# Patient Record
Sex: Female | Born: 1950 | Race: White | Hispanic: No | Smoking: Never smoker
Health system: Southern US, Academic
[De-identification: ages and names within clinical notes are randomized; demographics above are authoritative.]

## PROBLEM LIST (undated history)

## (undated) DIAGNOSIS — R002 Palpitations: Secondary | ICD-10-CM

## (undated) DIAGNOSIS — E785 Hyperlipidemia, unspecified: Secondary | ICD-10-CM

## (undated) DIAGNOSIS — I10 Essential (primary) hypertension: Secondary | ICD-10-CM

## (undated) HISTORY — DX: Palpitations: R00.2

## (undated) HISTORY — DX: Essential (primary) hypertension: I10

## (undated) HISTORY — PX: HX APPENDECTOMY: SHX54

## (undated) HISTORY — DX: Hyperlipidemia, unspecified: E78.5

## (undated) HISTORY — PX: HX GALL BLADDER SURGERY/CHOLE: SHX55

## (undated) HISTORY — PX: HX TUBAL LIGATION: SHX77

---

## 2001-12-21 ENCOUNTER — Ambulatory Visit (HOSPITAL_COMMUNITY): Payer: Self-pay

## 2007-06-22 IMAGING — MG MAMMO SCREEN W CAD
1 series · 4 of 4 positions shown · non-contrast
Comparison: 06/16/06 and 03/19/04.

Olivia, Cheo
HISTORY: Screening.

[Series 2: R CC · right · 4 of 4 slices shown]
[im 1/4]
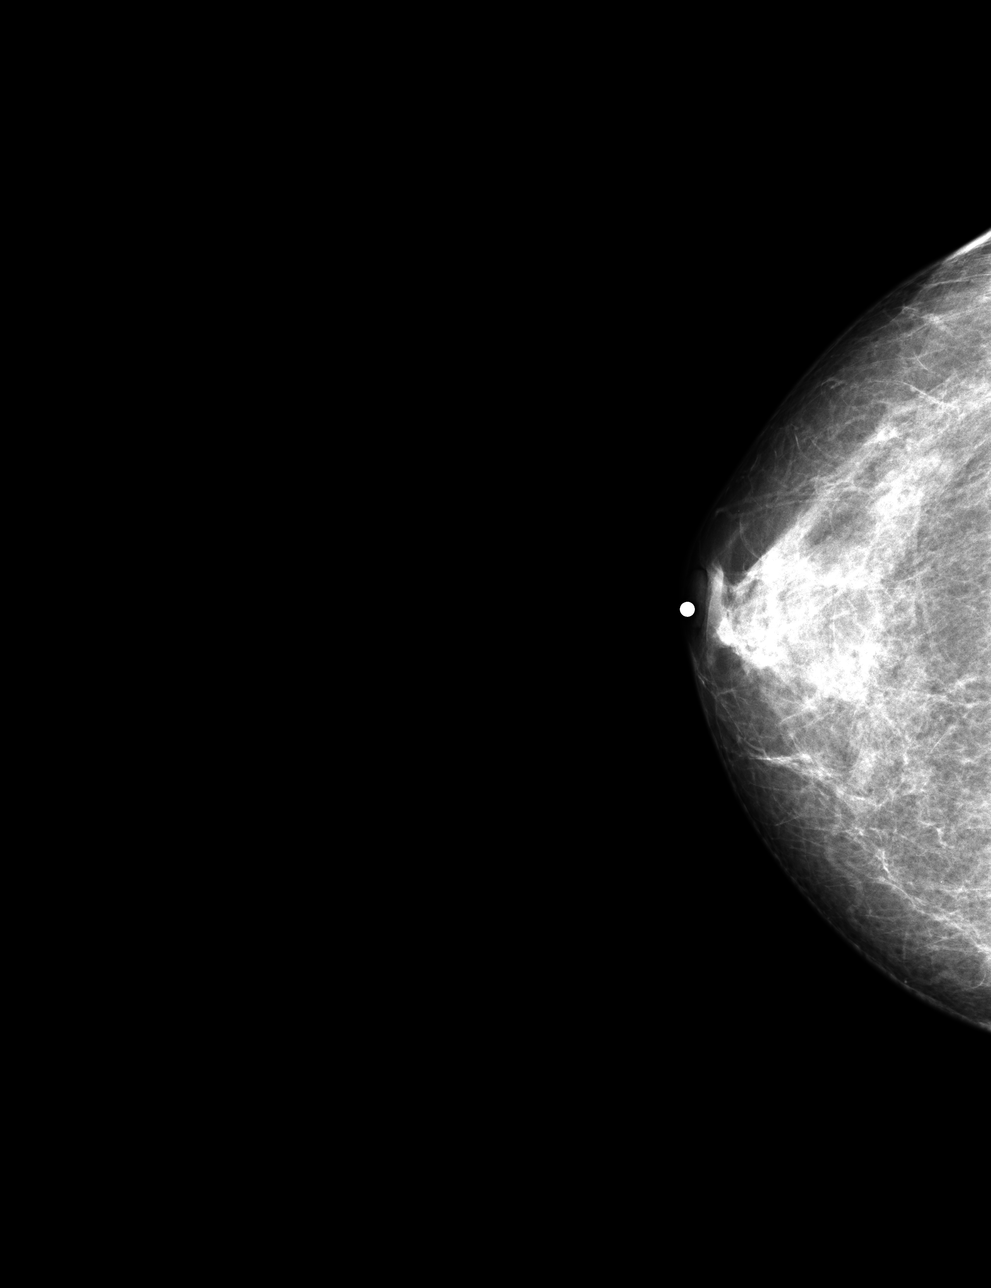
[im 2/4]
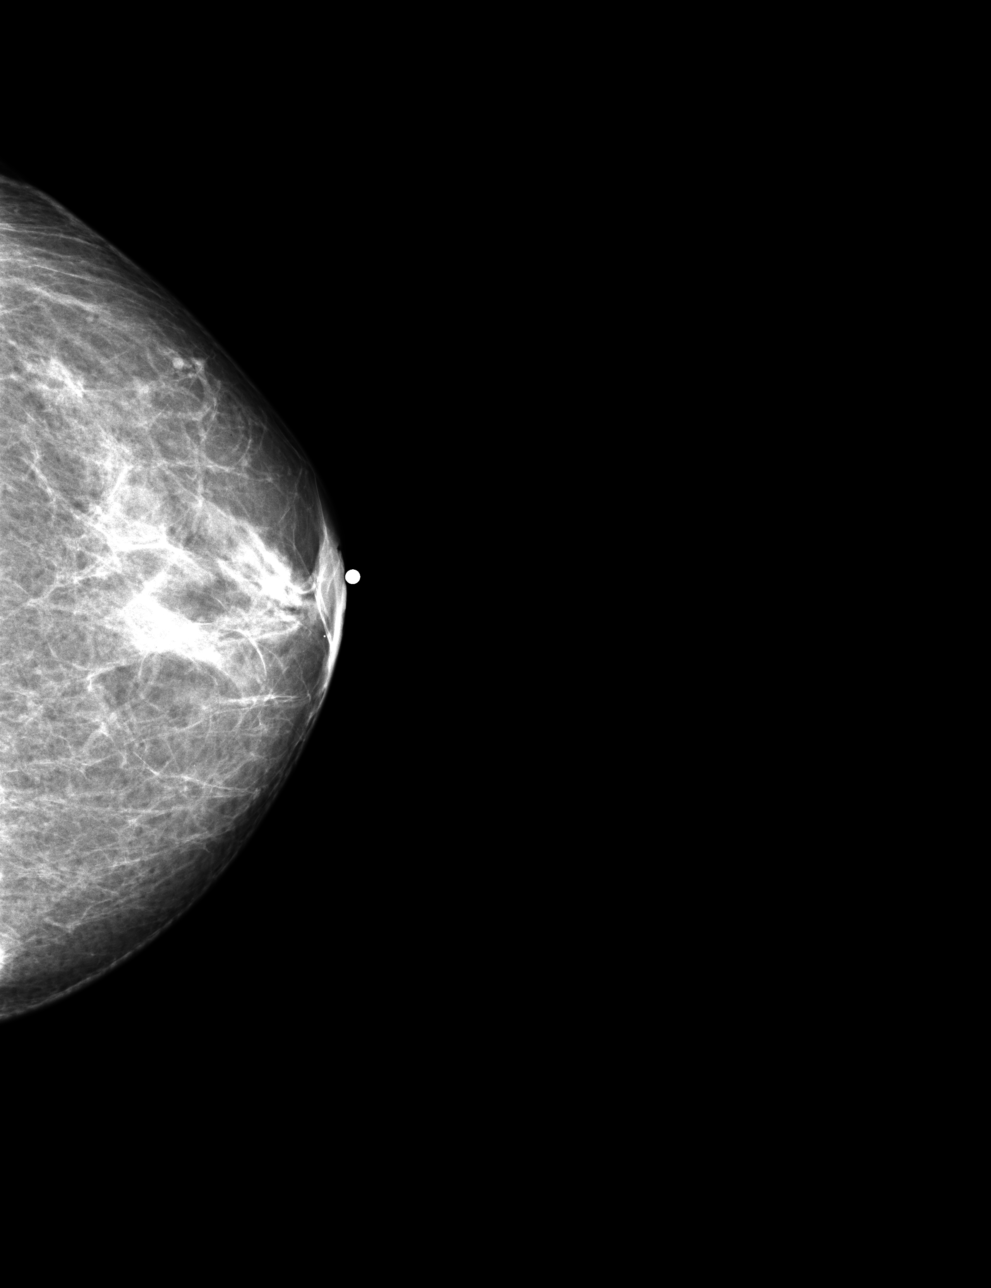
[im 3/4]
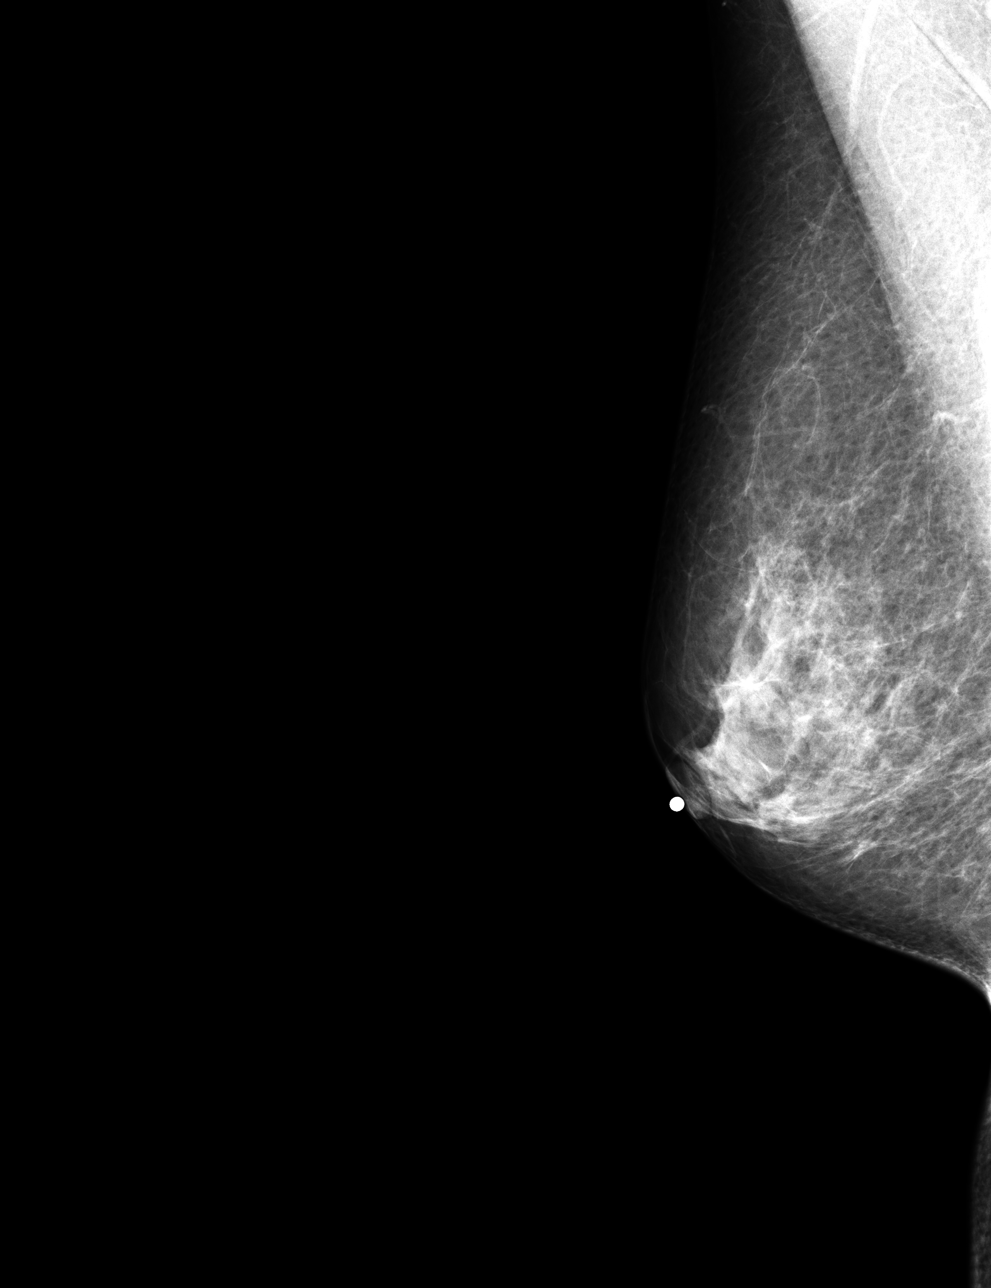
[im 4/4]
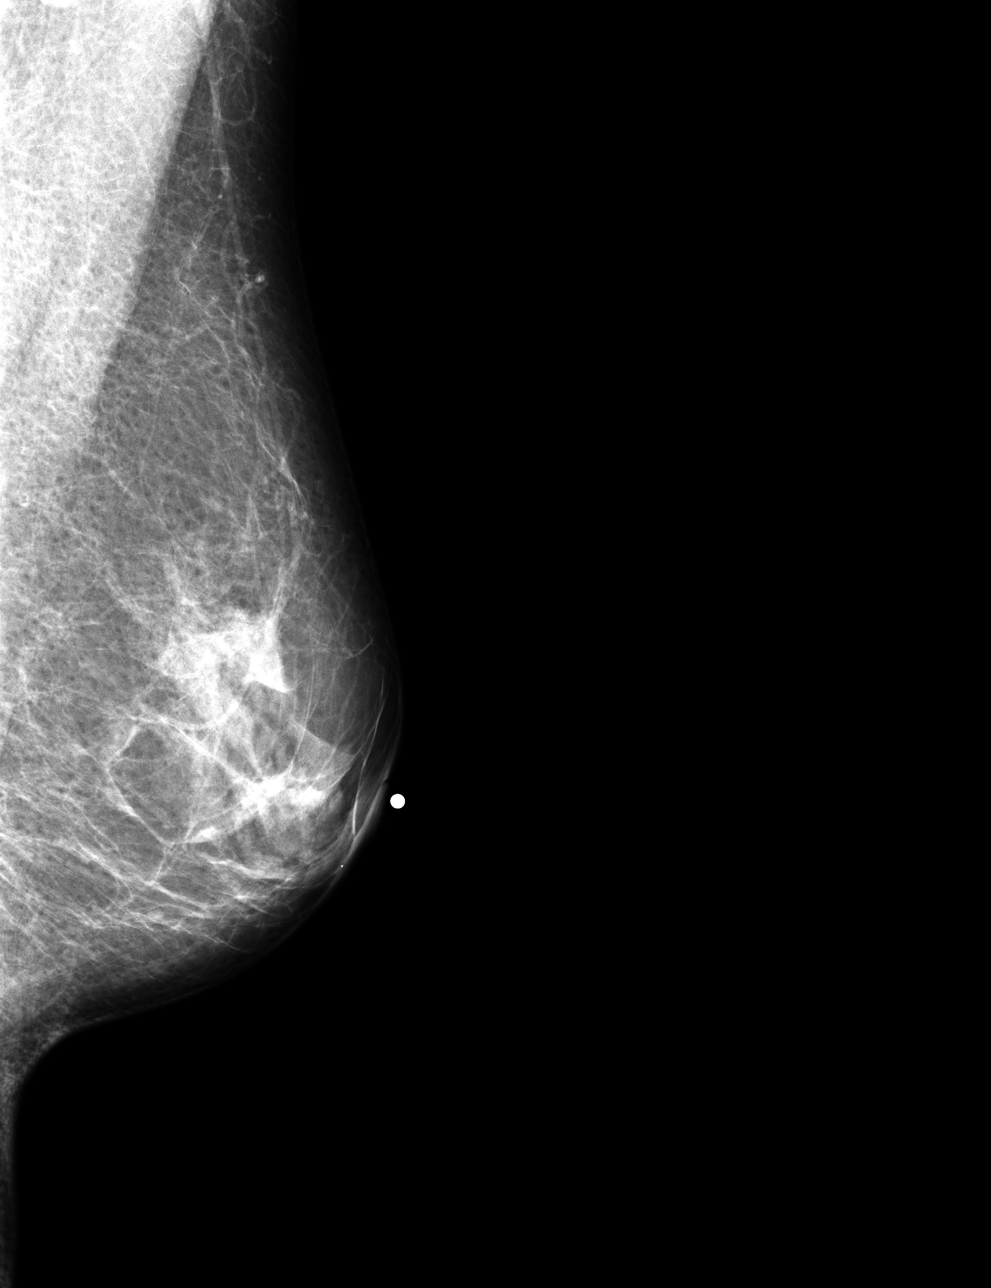

[4 of 4 positions shown; findings below may reference images not displayed]

FINDINGS: Digital mammography was performed with CAD. 

There is a moderate amount of parenchyma with a benign appearance in a symmetrical distribution. No malignant appearing masses, spiculated lesions, or suspicious microcalcifications are seen. No significant change is evident compared to prior studies dated 06/16/06 and 03/19/04.
NOTE:
In compliance with Federal regulations, the results of this mammogram are being sent to the patient.
IMPRESSION: No radiographic evidence of malignancy. 

Final Assessment Code:
Bi-Rads 1-negative

BI-RADS 0
Need additional imaging evaluation
BI-RADS 1
Negative mammogram
BI-RADS 2
Benign finding
BI-RADS 3
Probably benign finding: short-interval follow-up suggested
BI-RADS 4
Suspicious abnormality:  biopsy should be considered
BI-RADS 5
Highly suggestive of malignancy; appropriate action should be taken

________________________________

## 2008-08-05 IMAGING — MG MAMMO SCREEN W CAD
1 series · 4 of 4 positions shown · non-contrast
Comparison: none

------------- REPORT [id number] -------------
Ouna, Takaitei
HISTORY: Screening.
TECHNIQUE: Digital screening mammography was performed with CAD.

[Series 5: R CC · right · 4 of 4 slices shown]
[im 1/4]
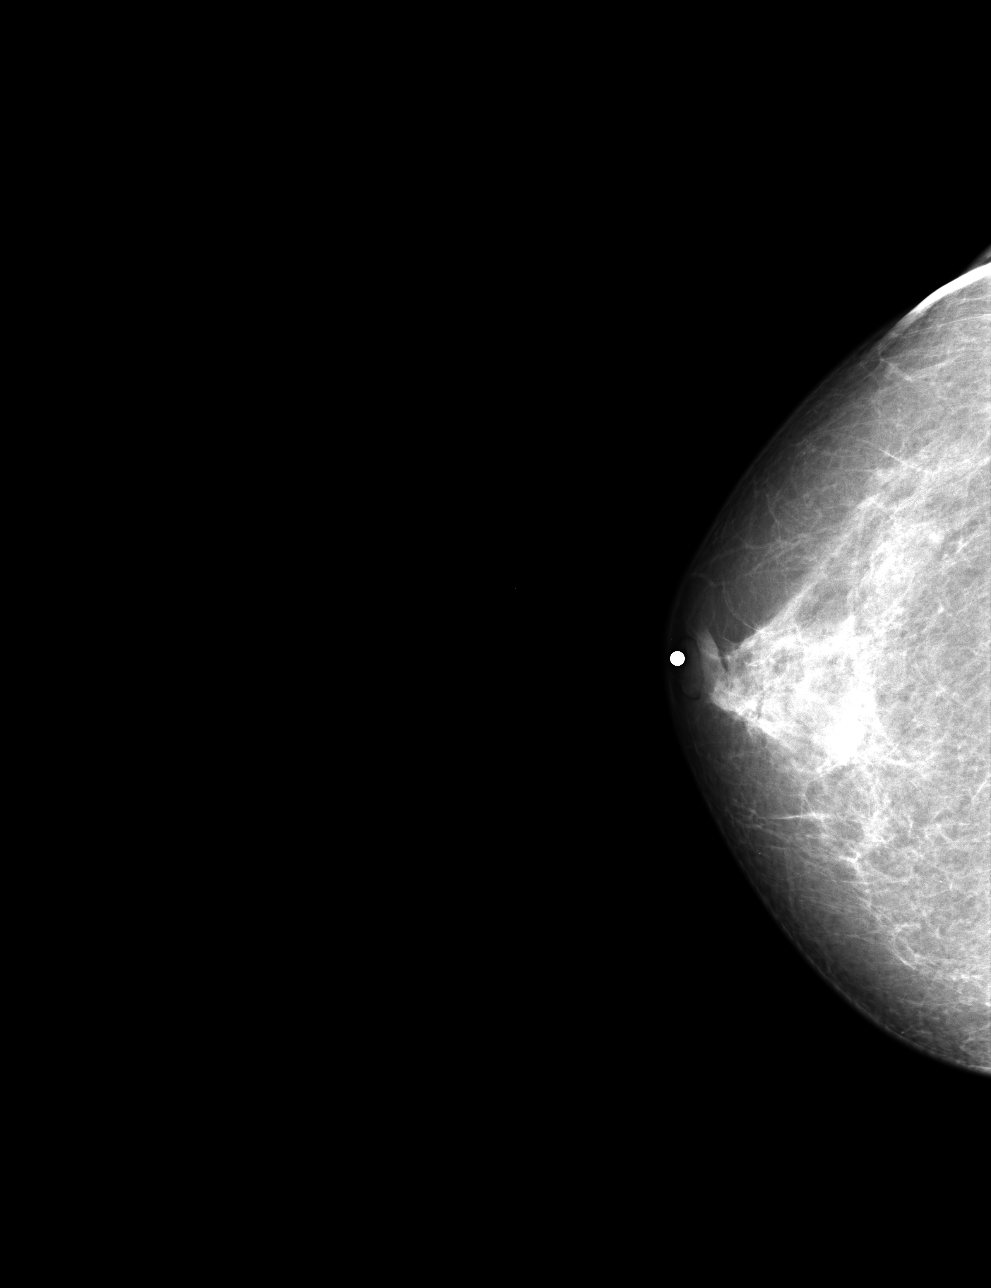
[im 2/4]
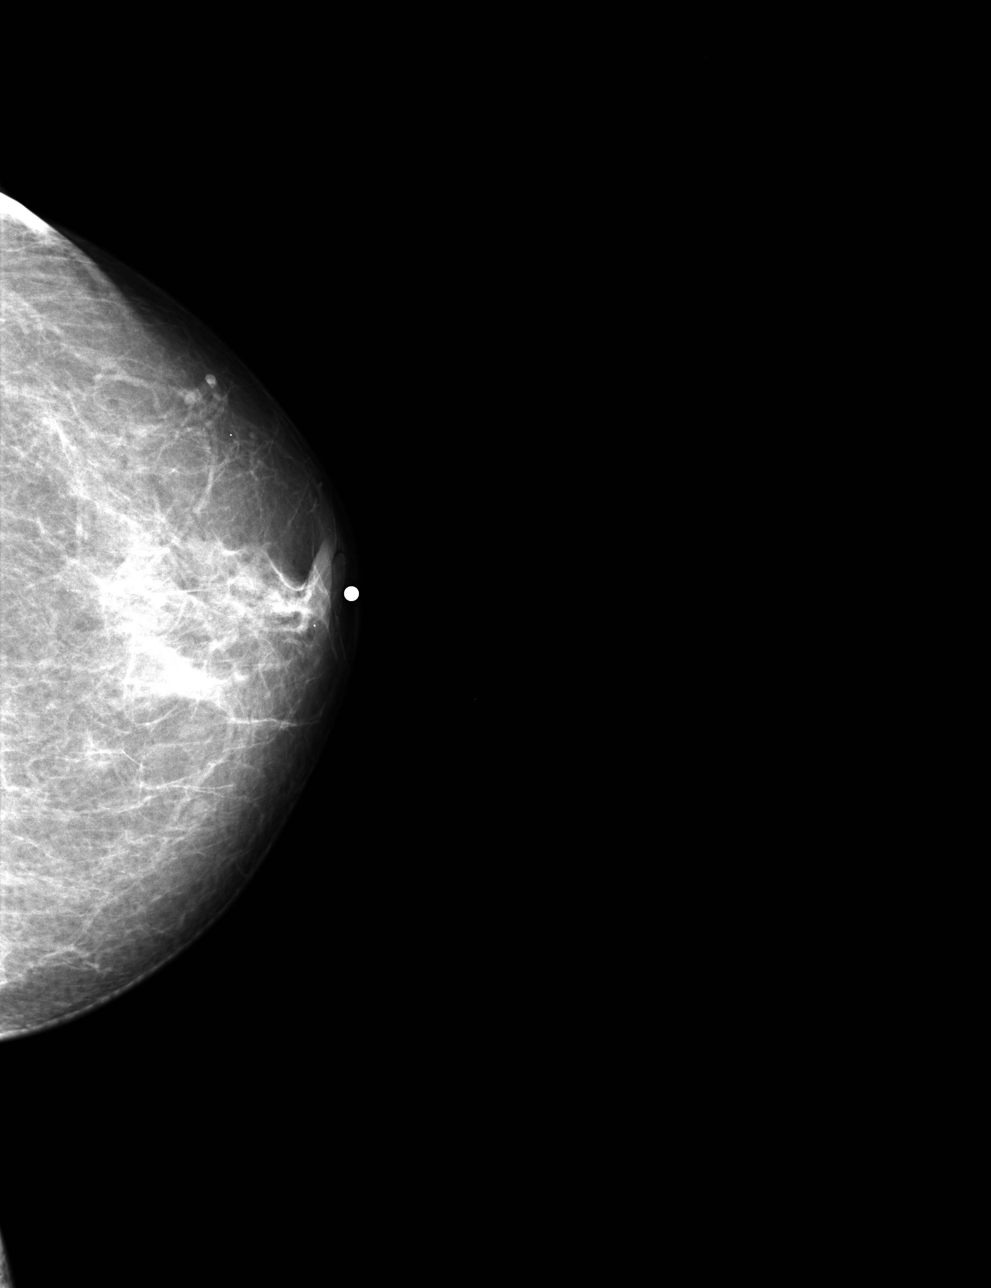
[im 3/4]
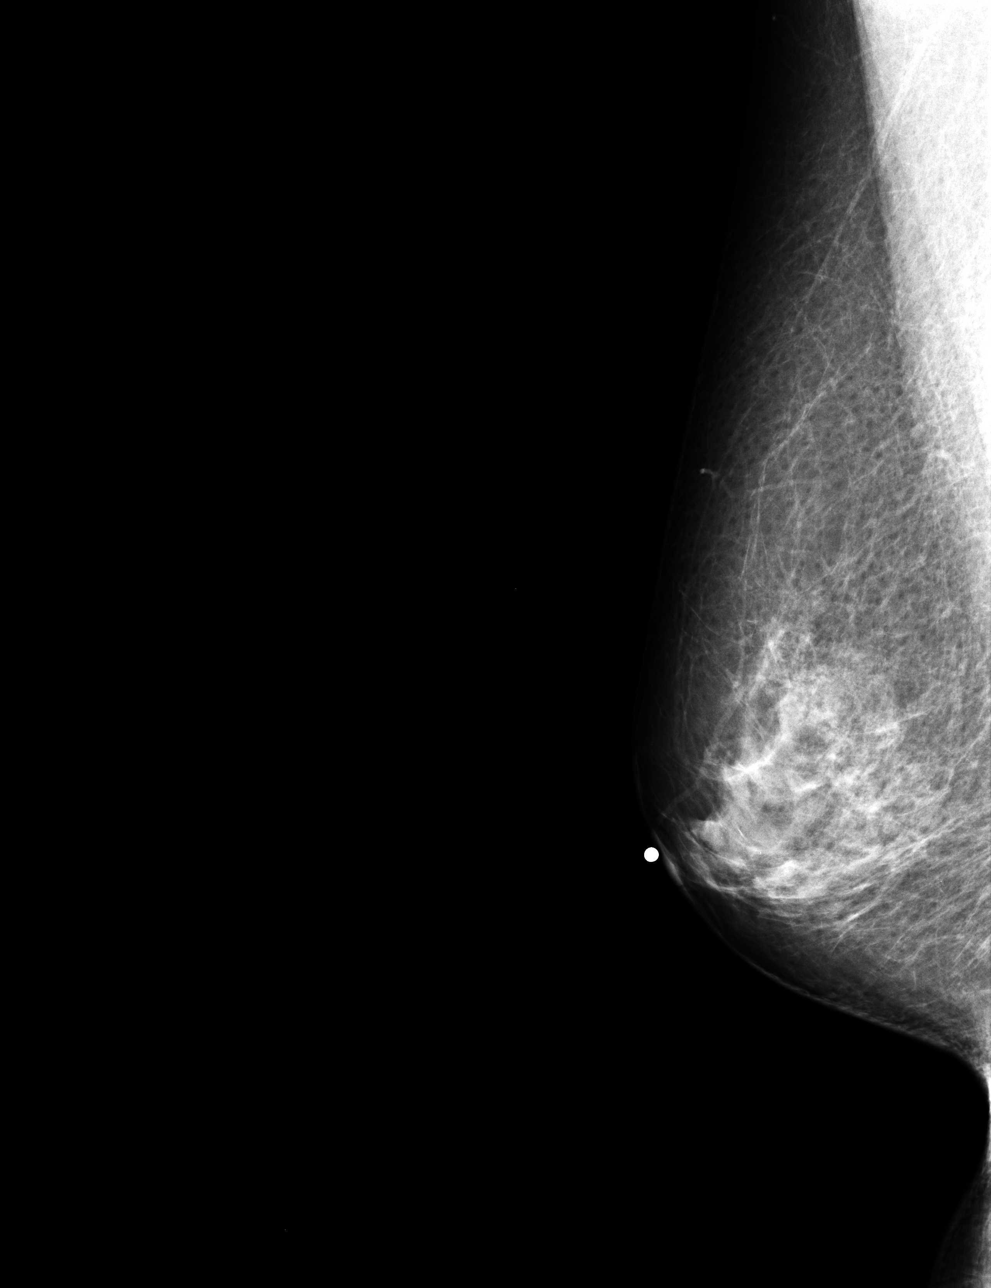
[im 4/4]
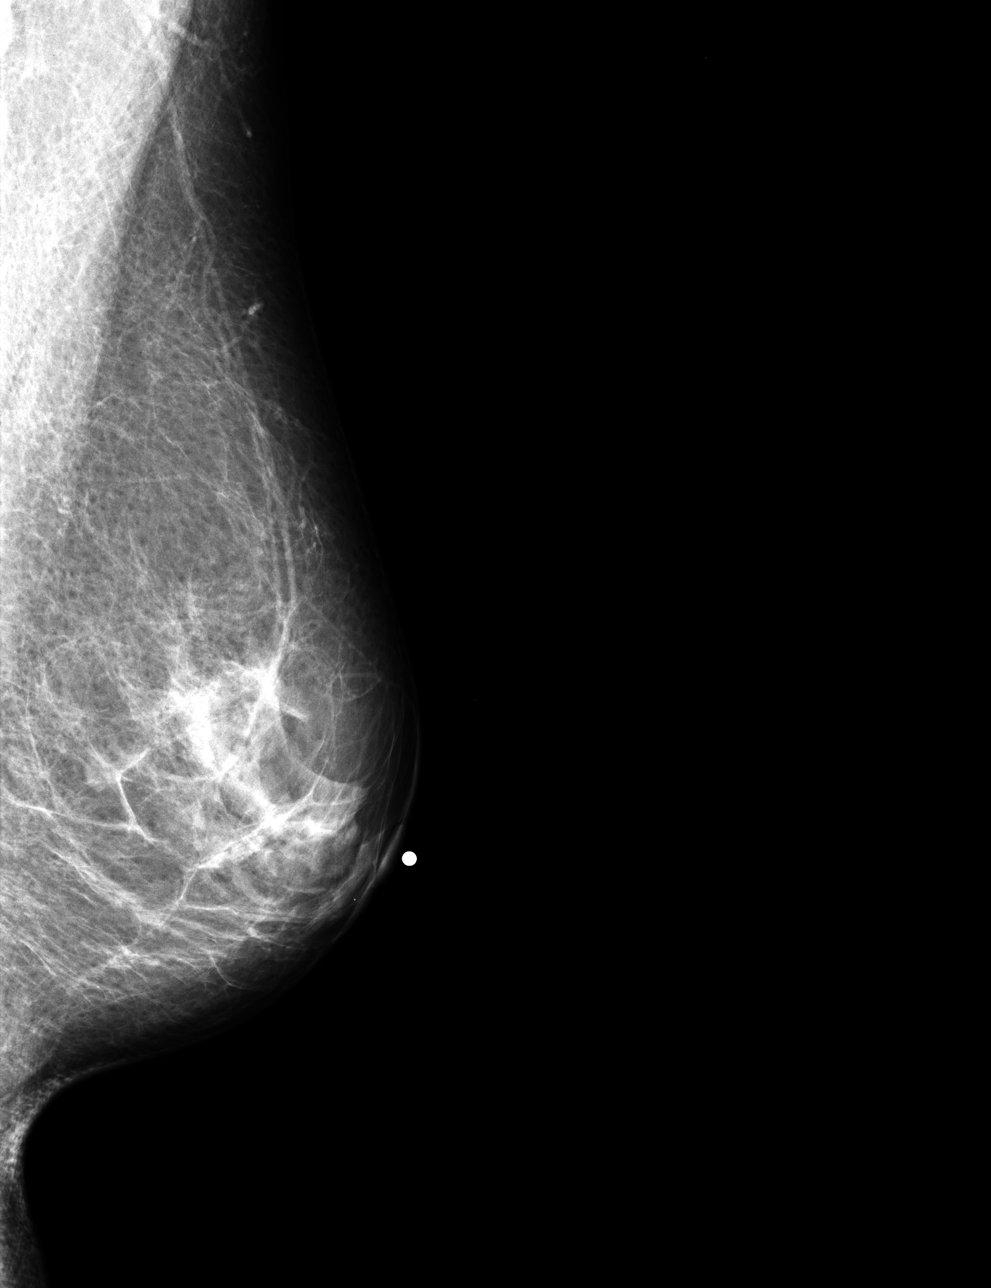

[4 of 4 positions shown; findings below may reference images not displayed]

FINDINGS: There is a moderate amount of parenchyma with a benign appearance in a symmetrical distribution.  No malignant-appearing masses, spiculated lesions, or suspicious microcalcifications are seen.  No significant change is evident compared to prior films dated July 24, 2009.

NOTE:

In compliance with Federal regulations, the results of this mammogram are being sent to the patient.
IMPRESSION: No radiographic evidence of malignancy

Final Assessment Code:  BI-RADS 1-Negative

BI-RADS 0
Need additional imaging evaluation
BI-RADS 1
Negative mammogram
BI-RADS 2
Benign finding
BI-RADS 3
Probably benign finding - short interval follow-up suggested
BI-RADS 4
Suspicious abnormality: biopsy should be considered
BI-RADS 5
Highly suggestive of malignancy; appropriate action should be taken

________________________________

------------- REPORT [id number] -------------
Espina, Jehrome
[address]

## 2009-08-09 IMAGING — MG MAMMO SCREEN W CAD
1 series · 4 of 4 positions shown · non-contrast
Comparison: 06/22/2007 and 08/05/2008.

Delvalle, Geary

EXAM:
BILATERAL DIGITAL SCREENING MAMMOGRAM WITH COMPUTER ASSISTED DIAGNOSIS:
HISTORY: Asymptomatic 58-year old with no family history of breast cancer in first degree relatives.

[Series 2: R CC · right · 4 of 4 slices shown]
[im 1/4]
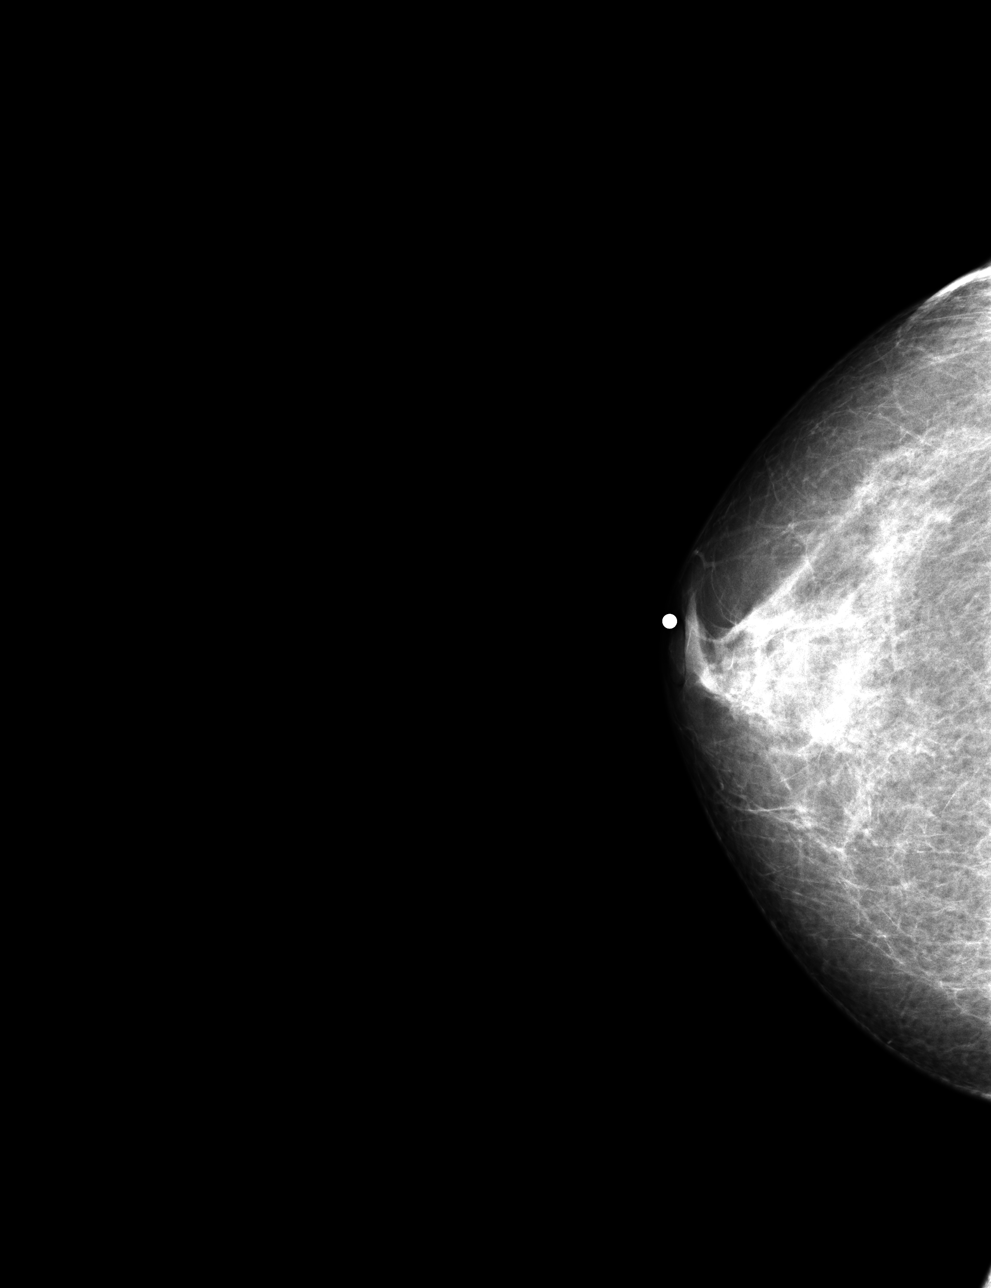
[im 2/4]
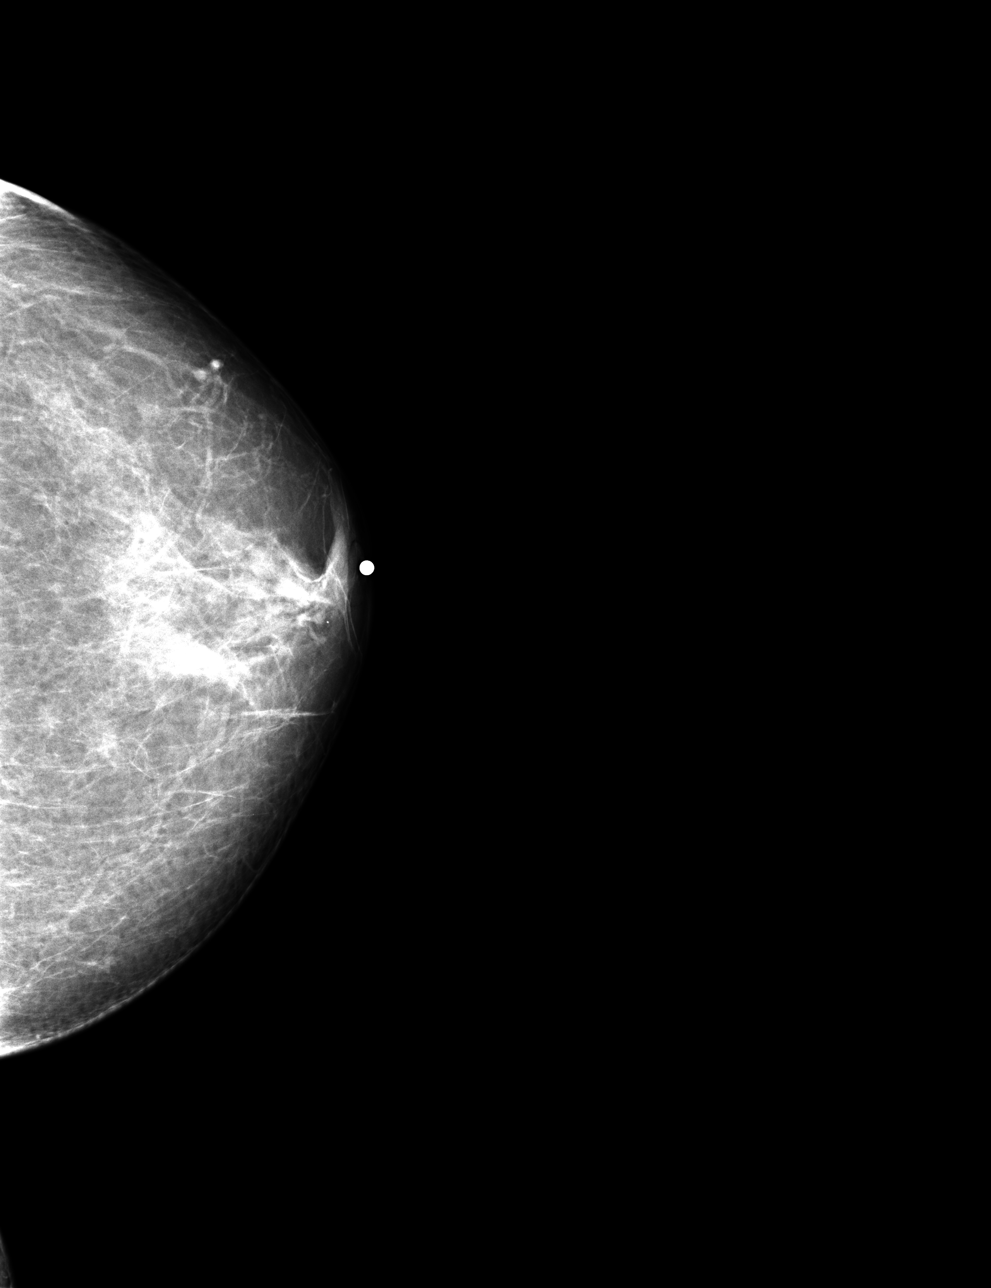
[im 3/4]
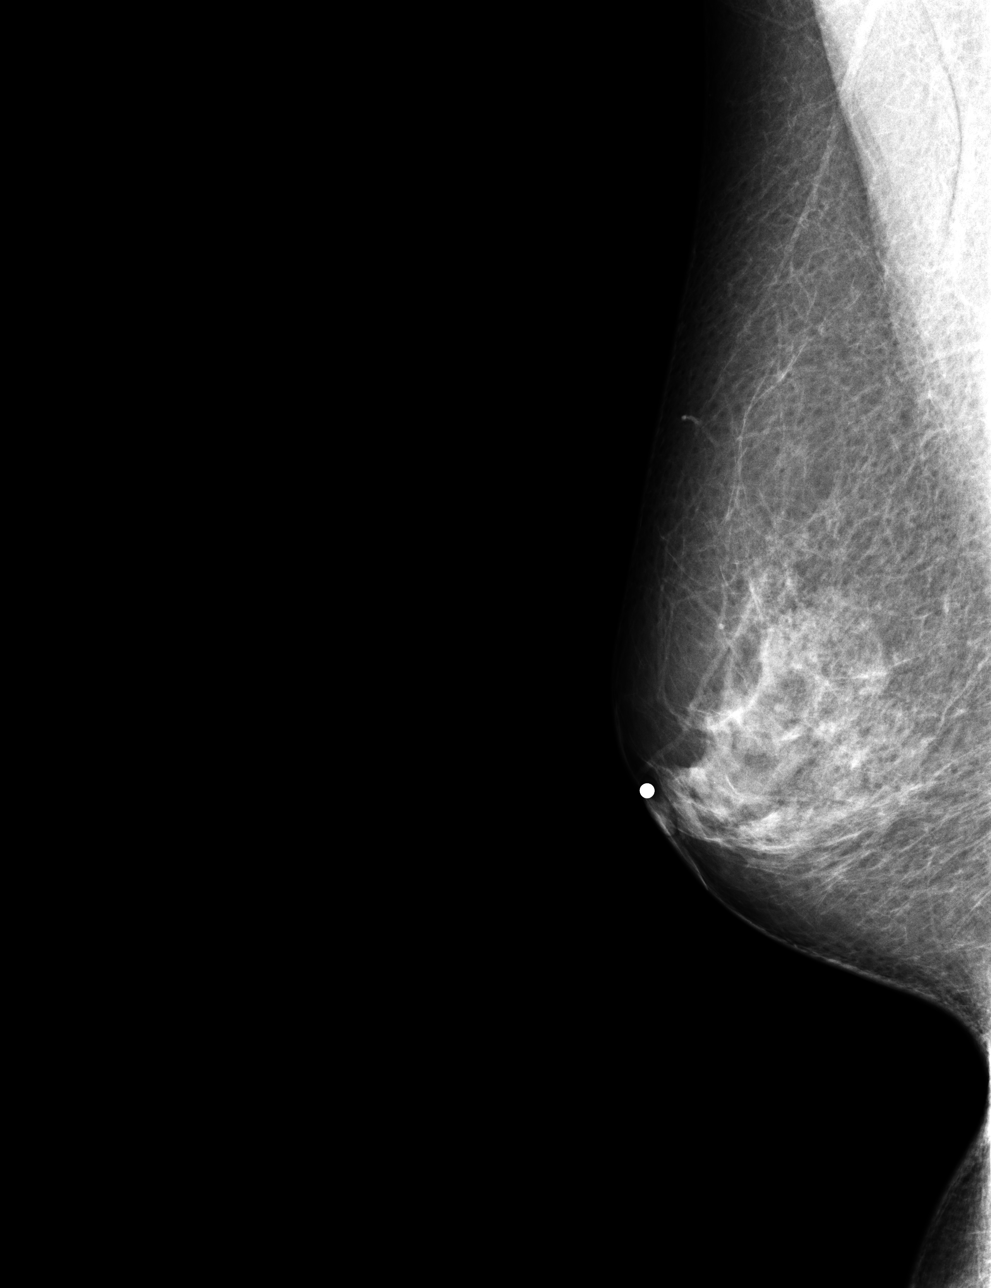
[im 4/4]
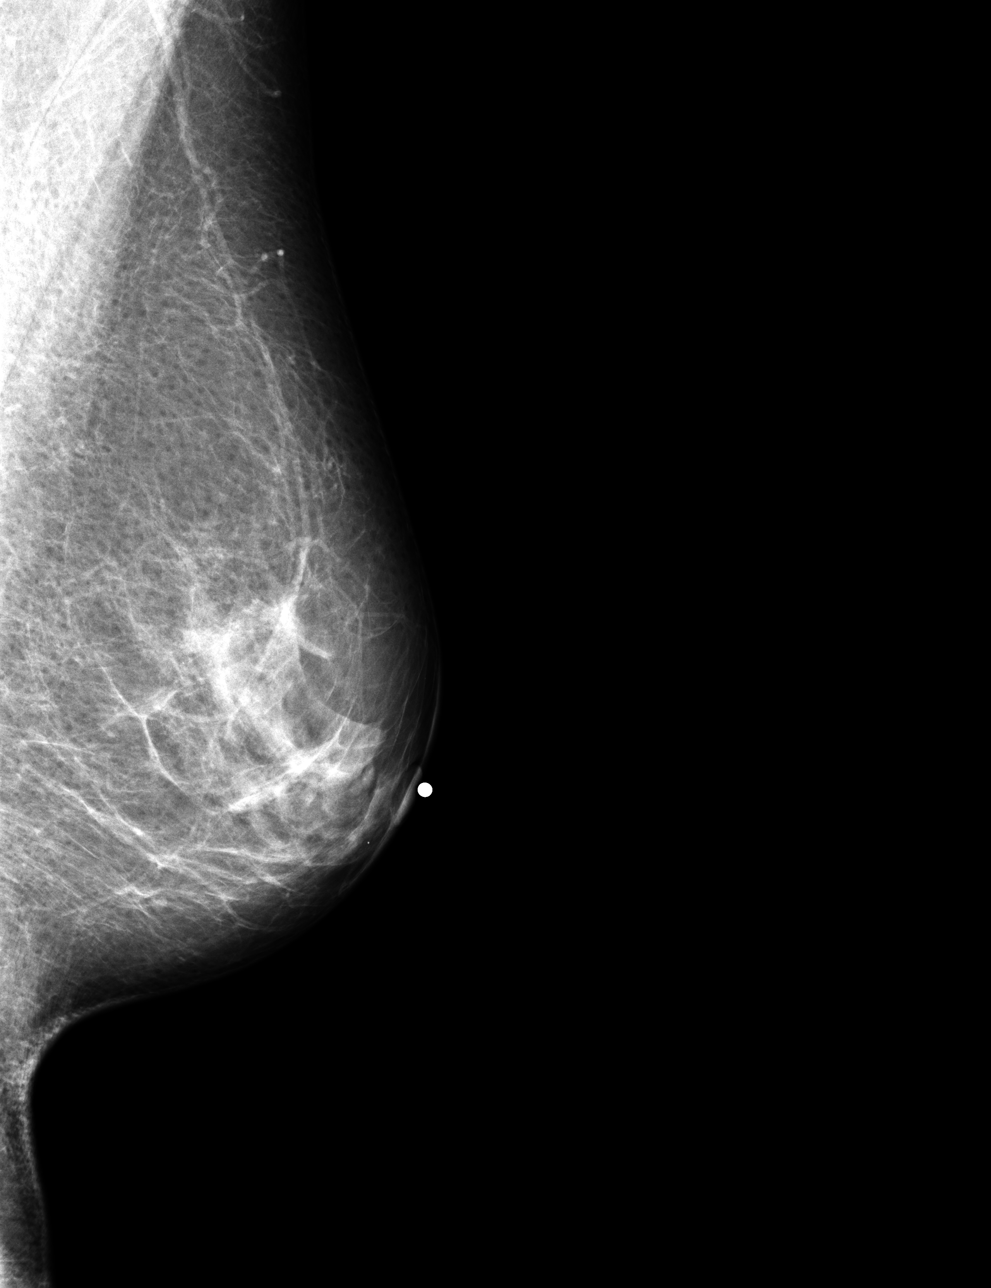

[4 of 4 positions shown; findings below may reference images not displayed]

FINDINGS: Breast tissues are mostly fatty.  No masses or architectural changes are seen.  No abnormal calcific densities or skin changes are seen.  Mild retraction of the left nipple is stable in appearance.

NOTE:

In compliance with Federal regulations, the results of this mammogram are being sent to the patient.
IMPRESSION: Stable mammographic findings including mildly retracted left nipple.  Clinical and mammographic followup are recommended at 12 months.

BI-RADS 2:

BI-RADS 0
Need additional imaging evaluation
BI-RADS 1
Negative mammogram
BI-RADS 2
Benign finding
BI-RADS 3
Probably benign finding - short interval follow-up suggested
BI-RADS 4
Suspicious abnormality: biopsy should be considered
BI-RADS 5
Highly suggestive of malignancy; appropriate action should be taken

________________________________

## 2010-08-14 IMAGING — MG MAMMO SCREEN W CAD
1 series · 4 of 4 positions shown · non-contrast
Comparison: 08/09/09 and 08/05/08, 06/22/07.

Mancera, Ricardus
INDICATION: Screening

RISK FACTOR:
No positive family history of breast cancer.
TECHNIQUE: Bilateral MLO and CC digital images were obtained. This study was reviewed using iCAD 200 software.

[Series 2: R CC · right · 4 of 4 slices shown]
[im 1/4]
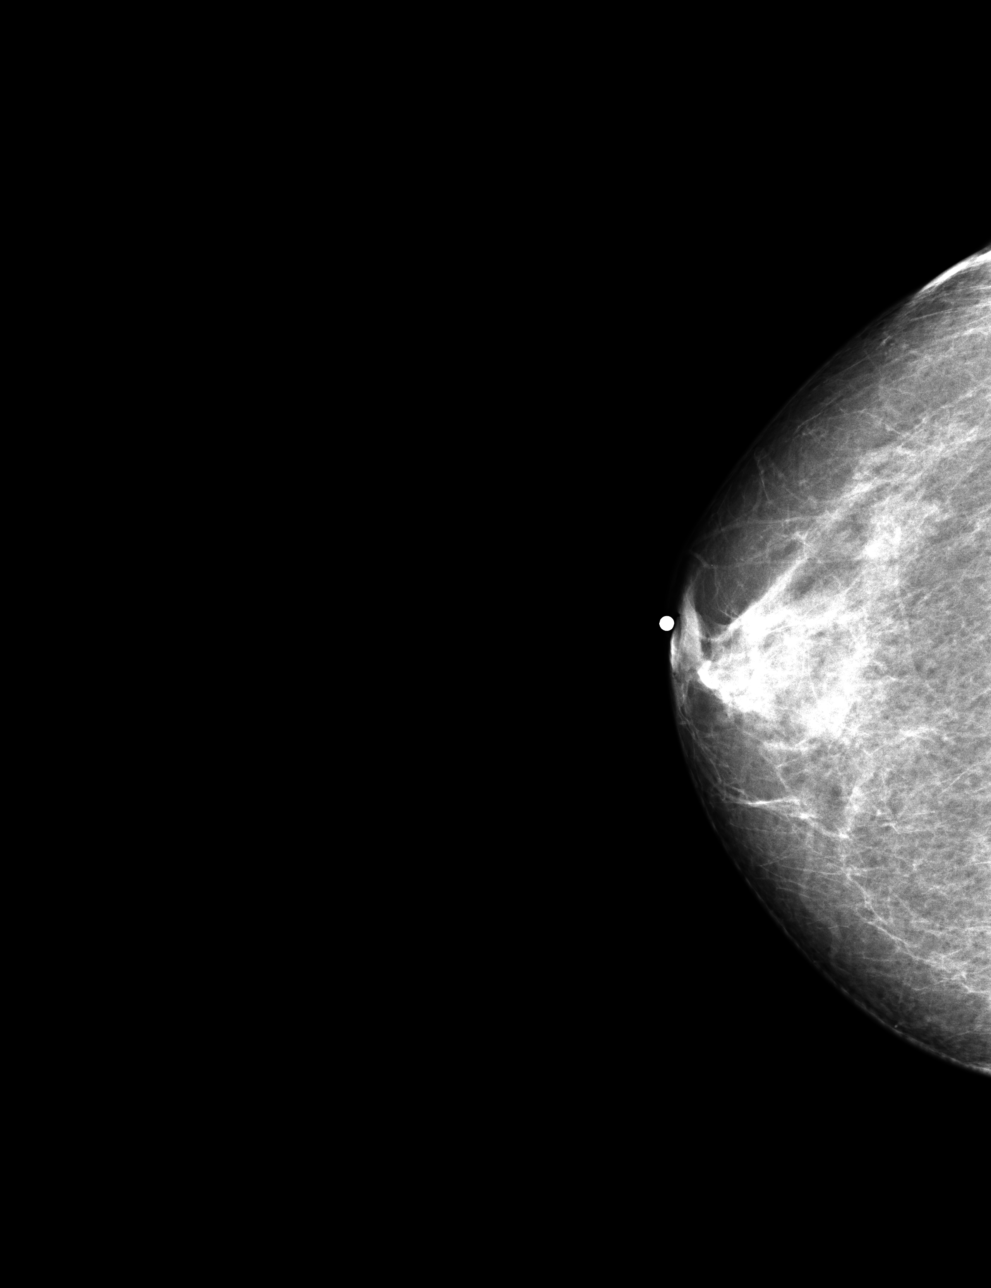
[im 2/4]
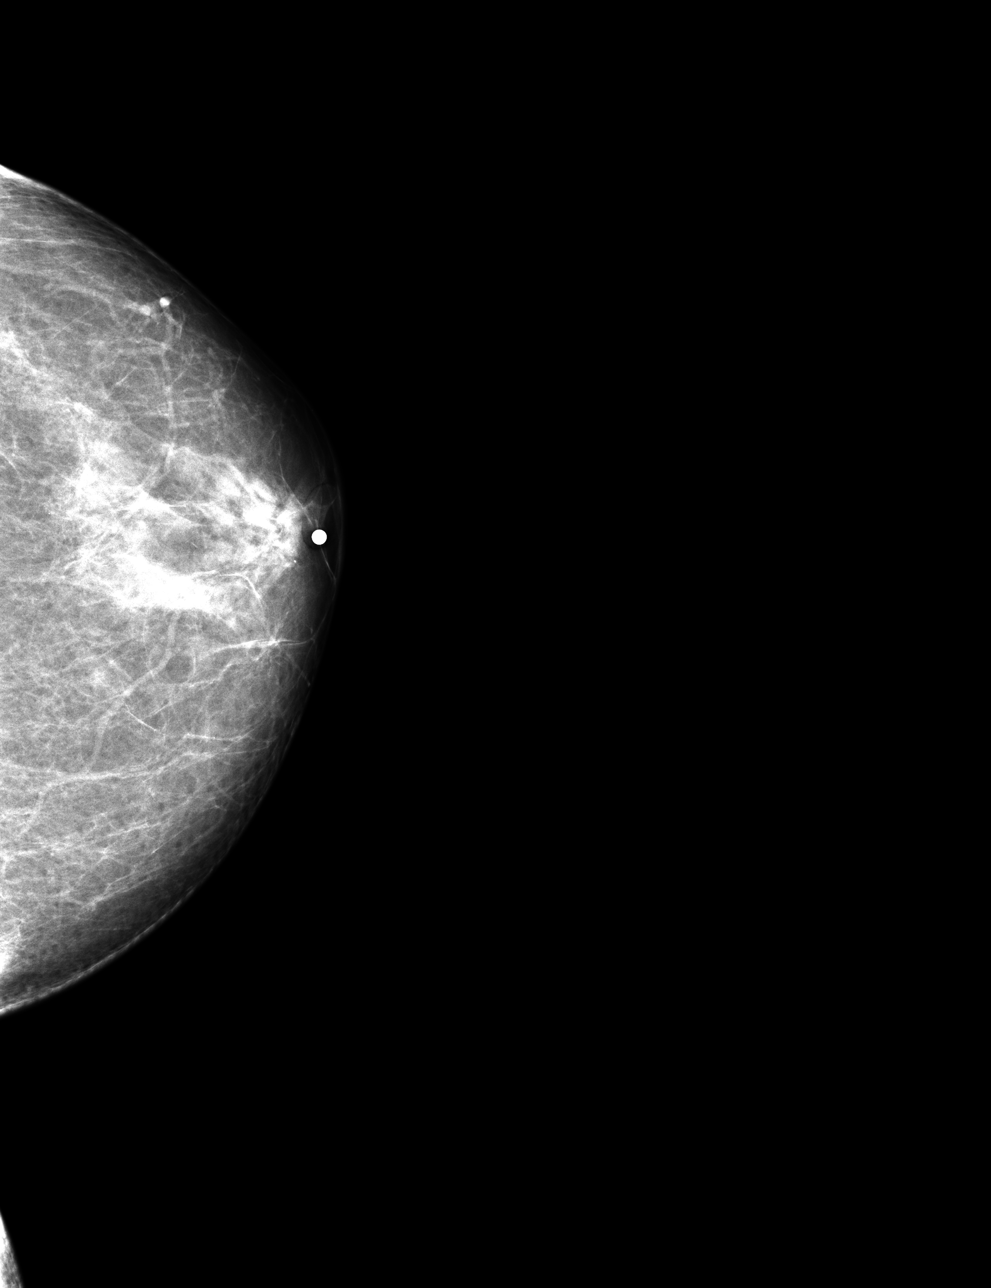
[im 3/4]
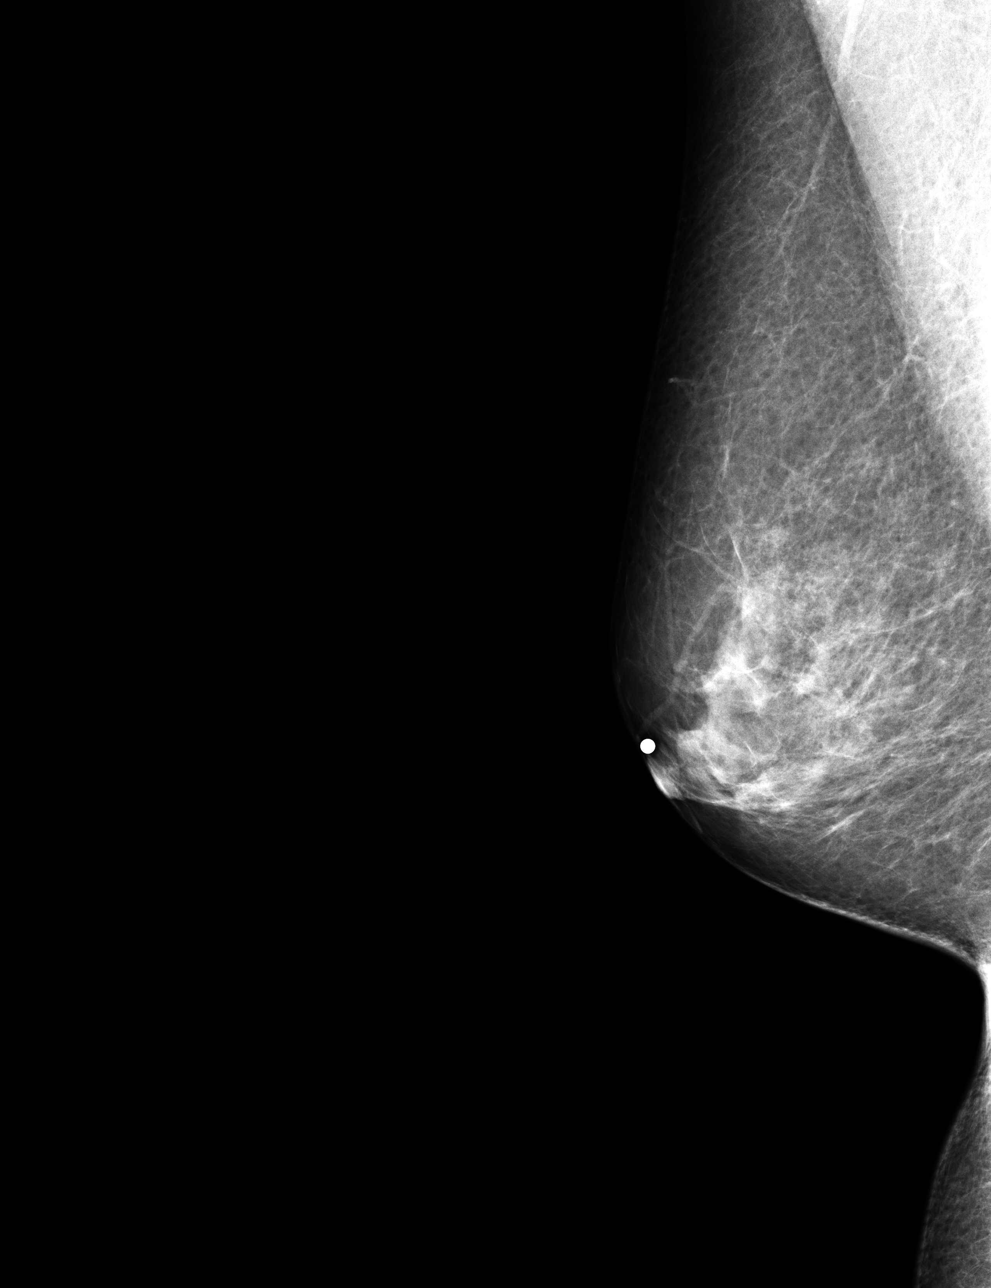
[im 4/4]
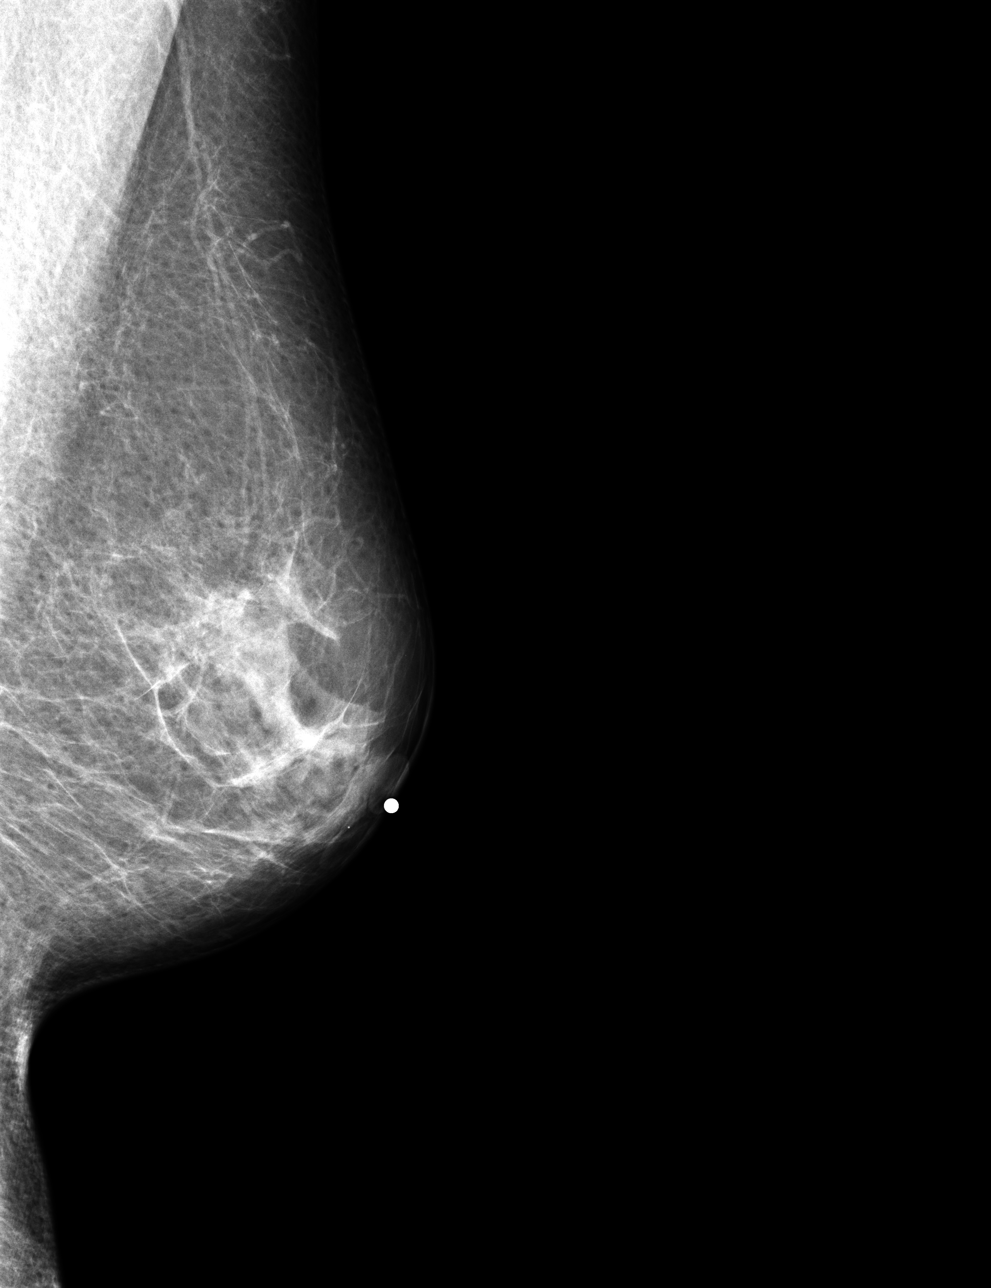

[4 of 4 positions shown; findings below may reference images not displayed]

FINDINGS: The breasts have scattered fibroglandular density. There is no skin thickening, or nipple retraction noted. No suspicious masses, microcalcifications or architectural distortion are identified.
IMPRESSION: BI-RADS 1

RECOMMENDATION: Annual screening per ACR guidelines and monthly self breast examination. 

FINAL ASSESSMENT CODE:
BI-RADS 1

________________________________

## 2011-08-26 IMAGING — MG MAMMO SCREEN W CAD
1 series · 4 of 4 positions shown · non-contrast
Comparison: 

Burakiene, Egidijus Ir

Exam:
Bilateral digital screening mammogram with CAD
INDICATION: Annual.

[Series 2: R CC · right · 4 of 4 slices shown]
[im 1/4]
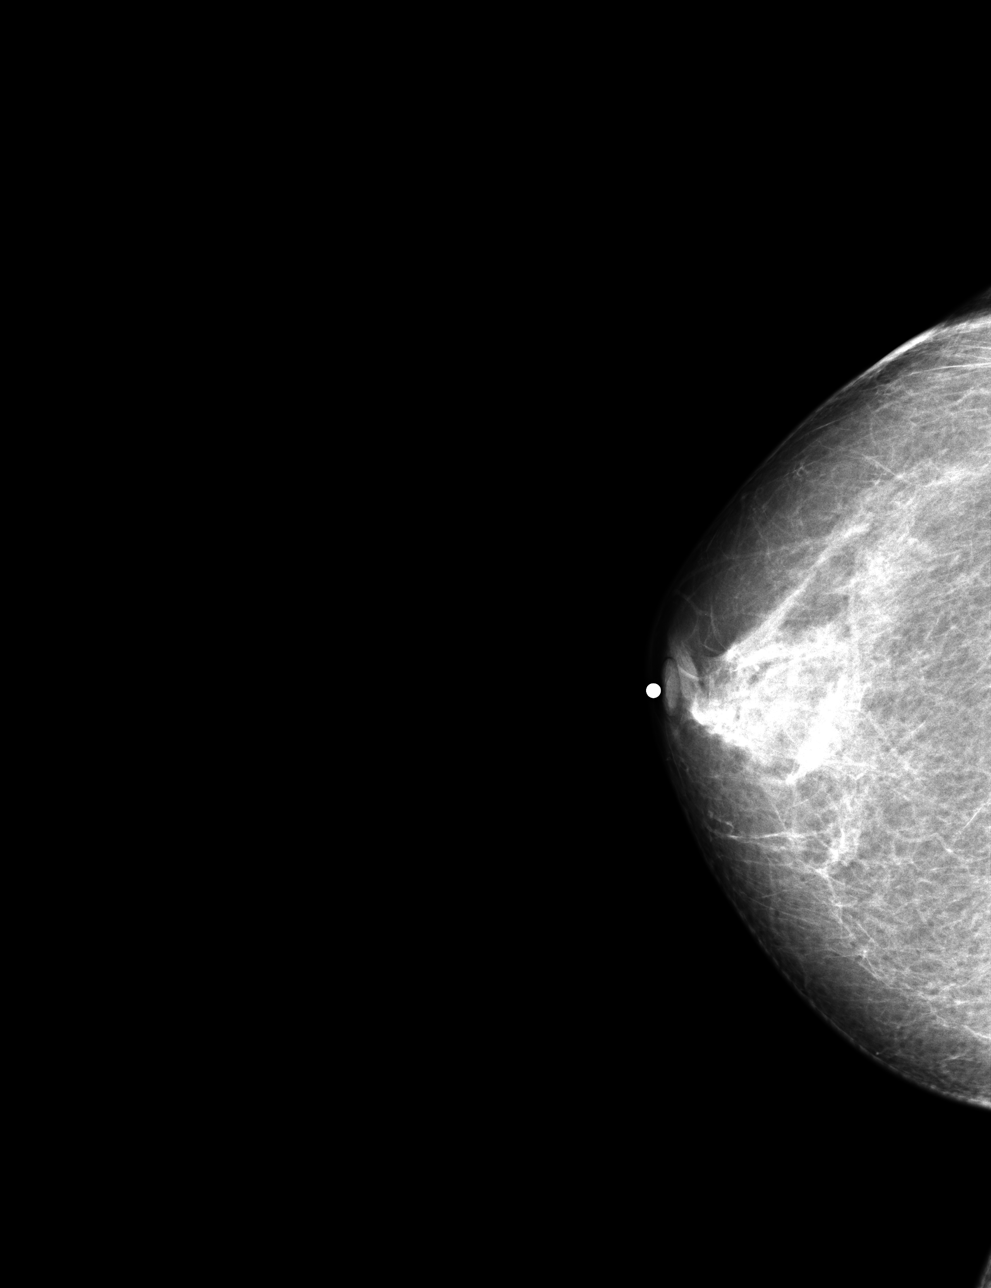
[im 2/4]
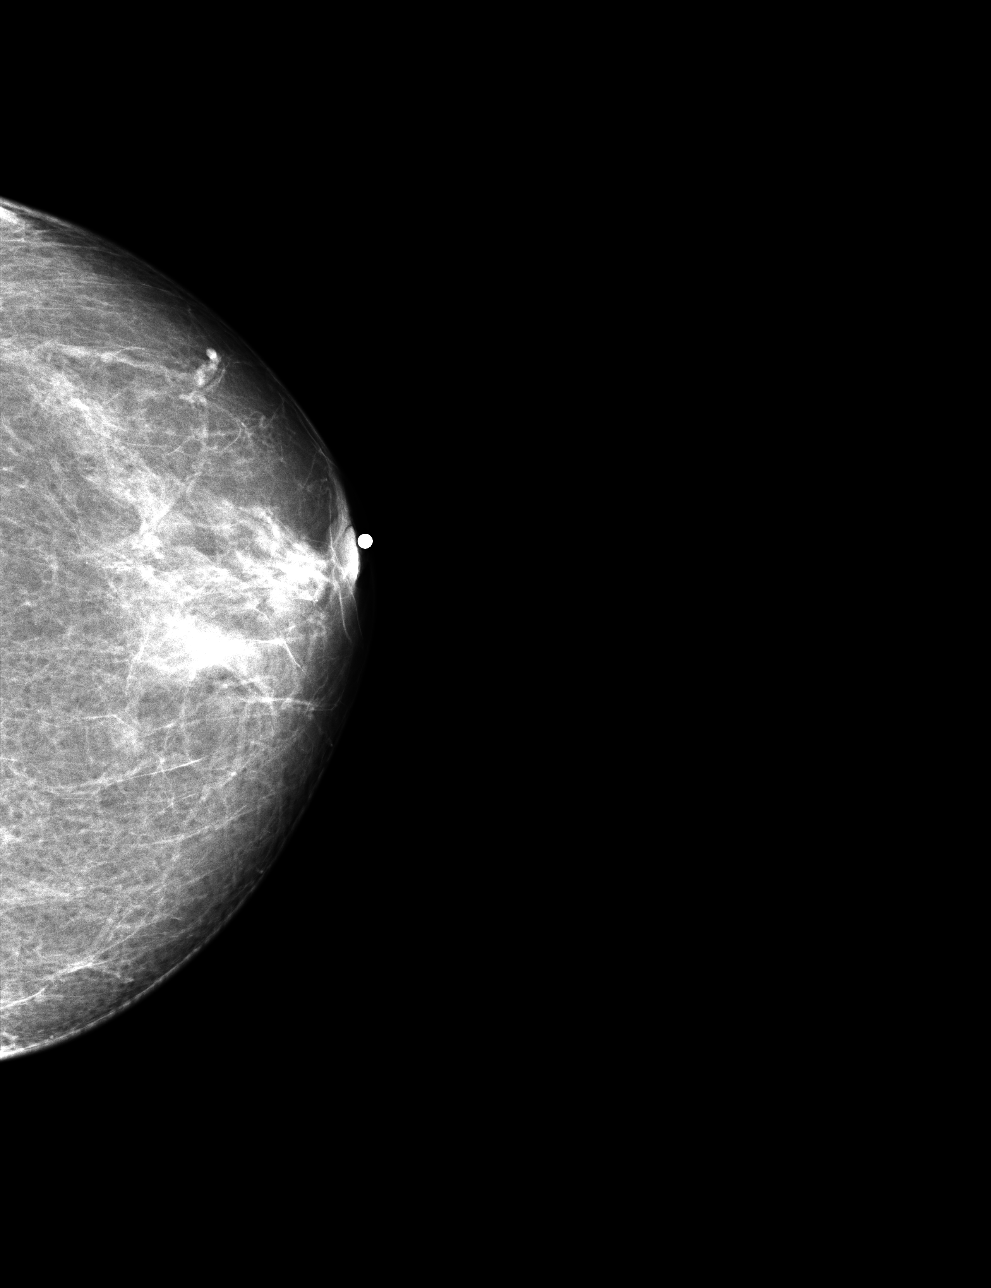
[im 3/4]
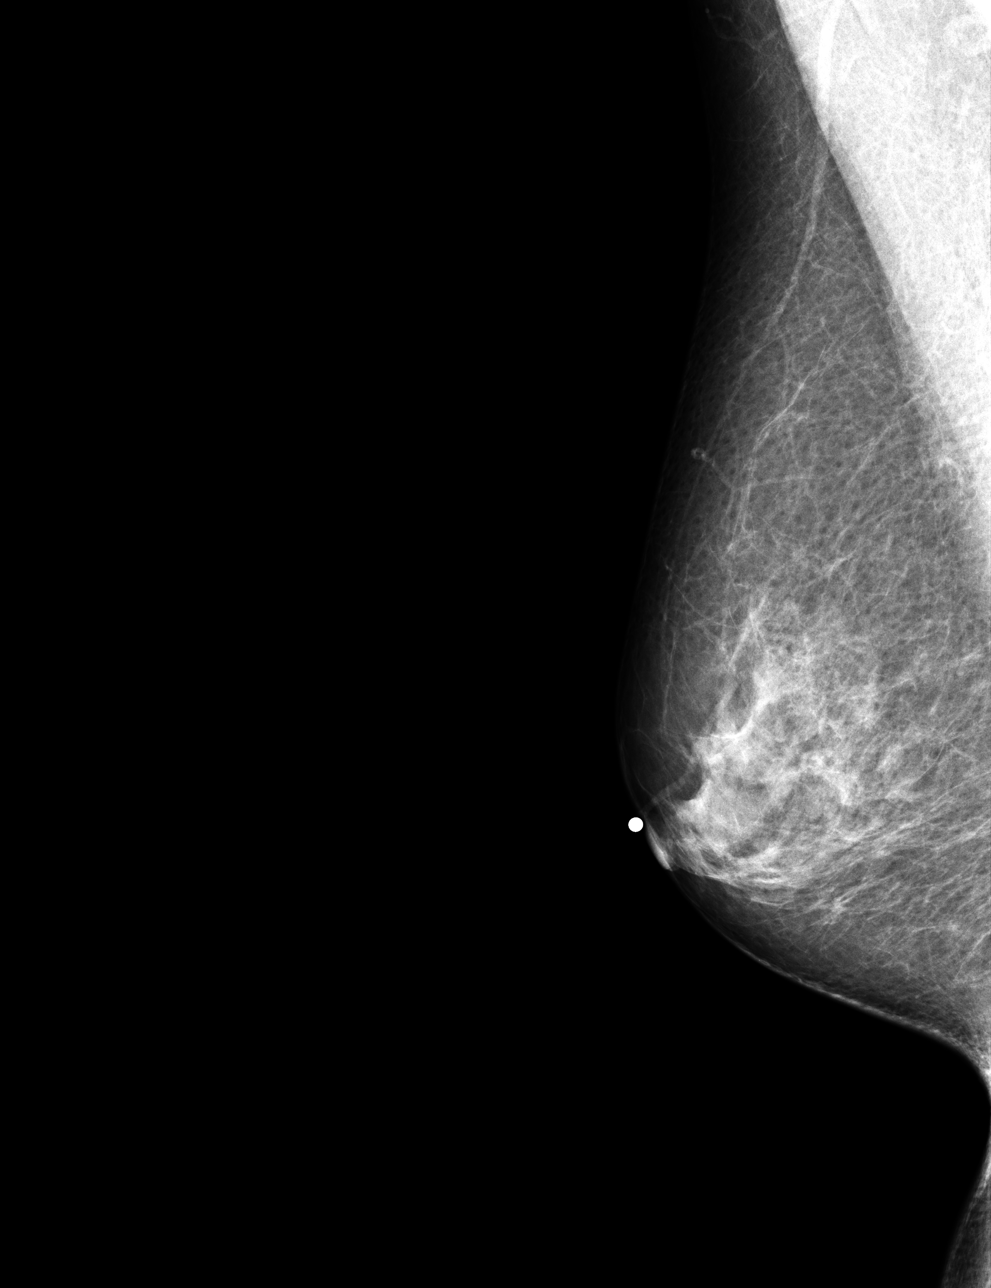
[im 4/4]
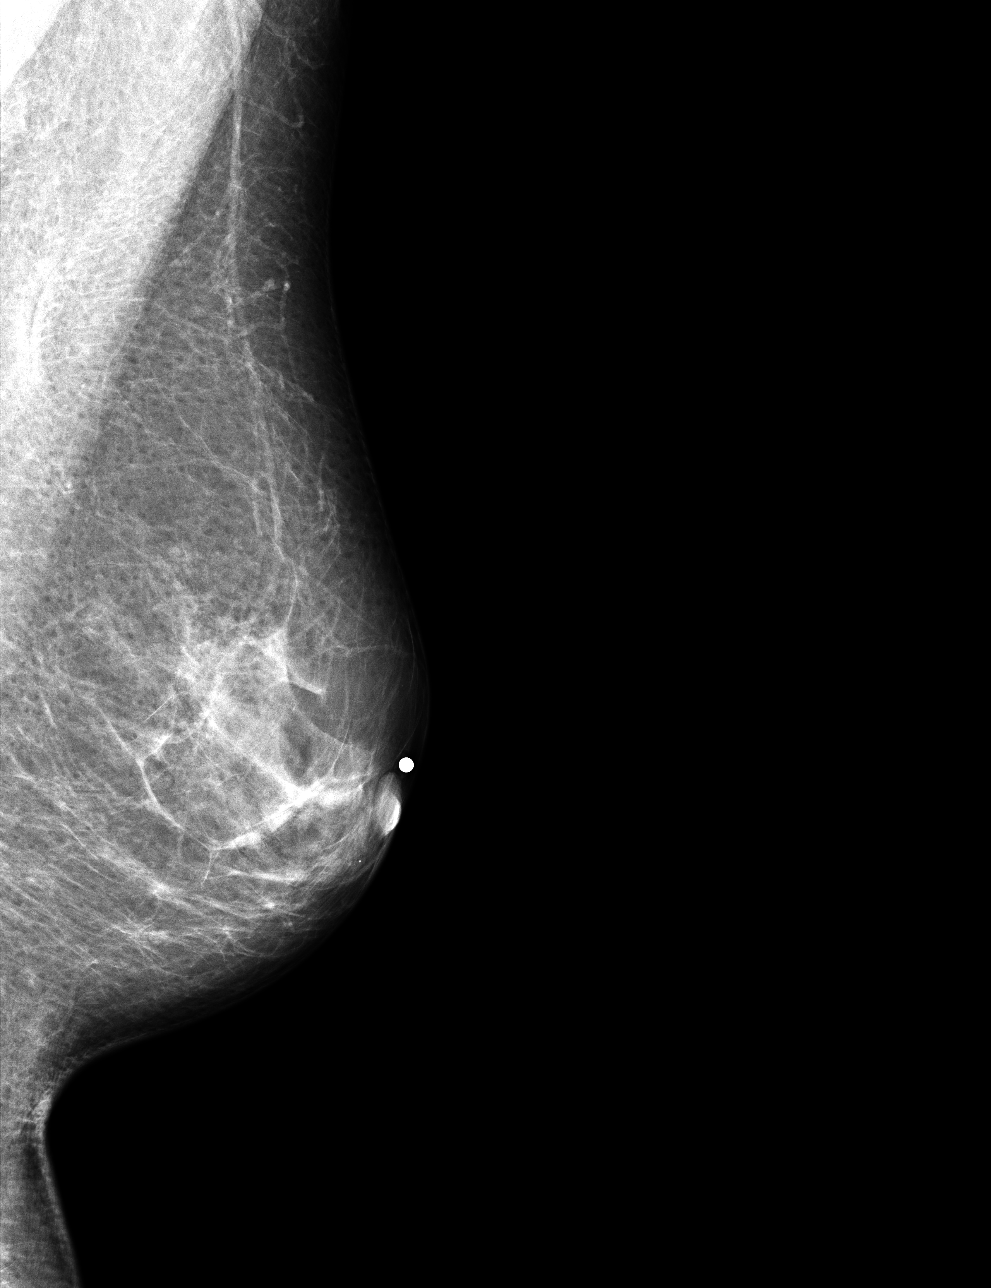

[4 of 4 positions shown; findings below may reference images not displayed]

FINDINGS: There are scattered fibroglandular elements. There is no mass or suspicious cluster of microcalcifications. There is no architectural distortion, skin thickening or nipple retraction. 

NOTE:

In compliance with Federal regulations, the results of this mammogram are being sent to the patient.
IMPRESSION: Bi-Rads 2-Benign findings. 
RECOMMENDATIONS: Annual screening mammogram as per ACR guidelines is recommended. 
Final Assessment Code:
Bi-Rads 2 

BI-RADS 0
Need additional imaging evaluation
BI-RADS 1
Negative mammogram
BI-RADS 2
Benign finding
BI-RADS 3
Probably benign finding: short-interval follow-up suggested
BI-RADS 4
Suspicious abnormality:  biopsy should be considered
BI-RADS 5
Highly suggestive of malignancy; appropriate action should be taken

________________________________

## 2012-08-26 IMAGING — MG MAMMO SCREEN W CAD
1 series · 5 of 5 positions shown · non-contrast
Comparison: 08/26/2011.

Dowd, Donet
INDICATION: Annual.

[Series 2: R CC · oblique · right · 5 of 5 slices shown]
[im 1/5]
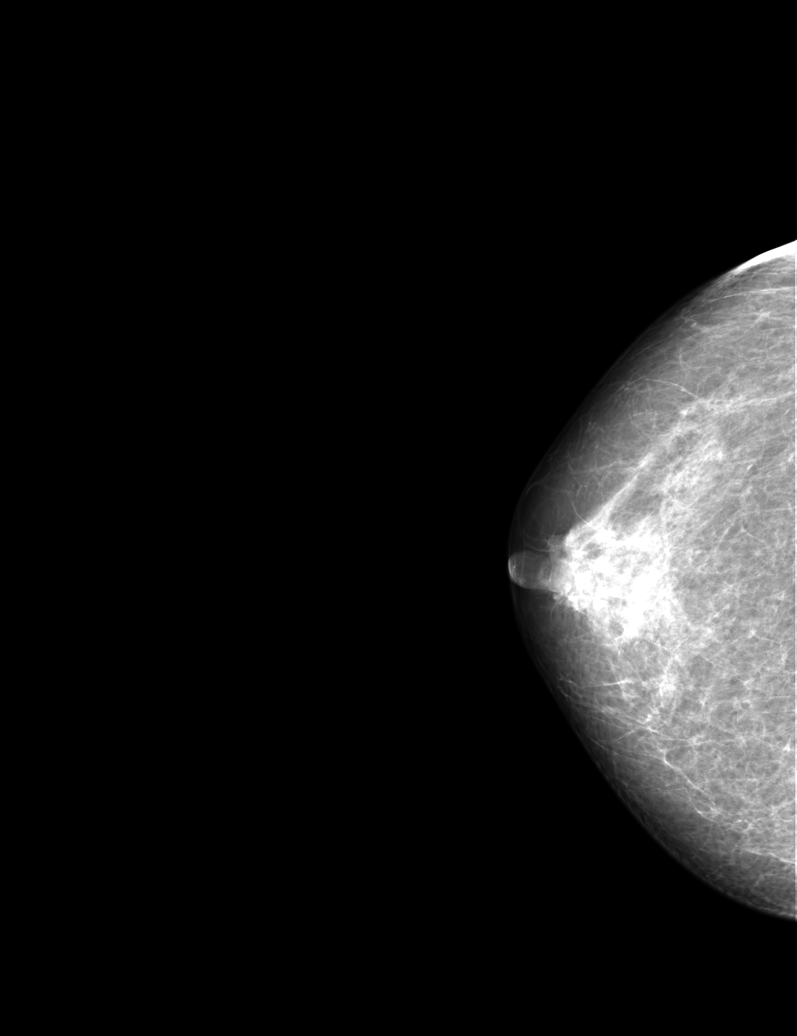
[im 2/5]
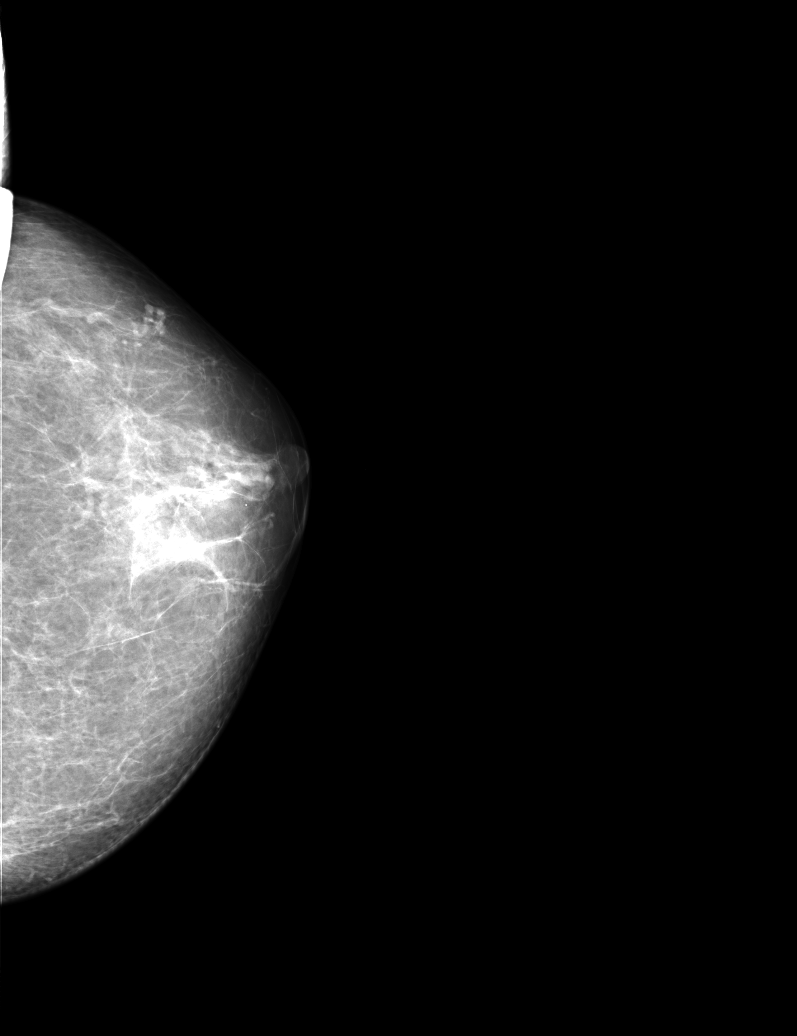
[im 3/5]
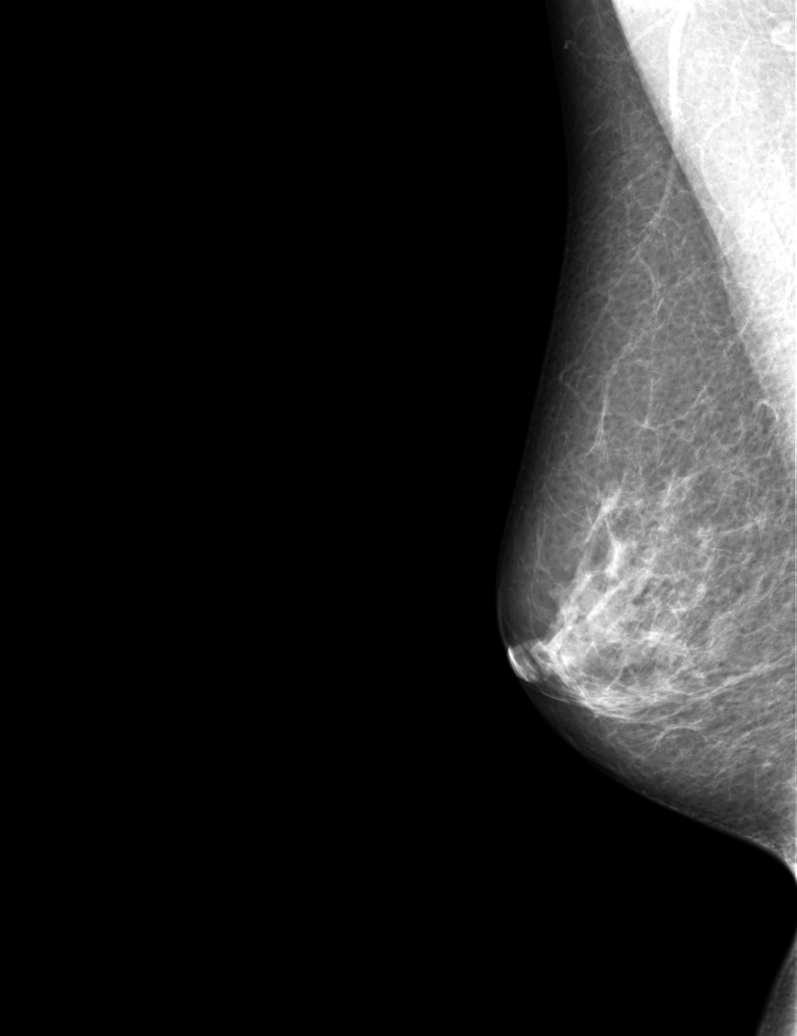
[im 4/5]
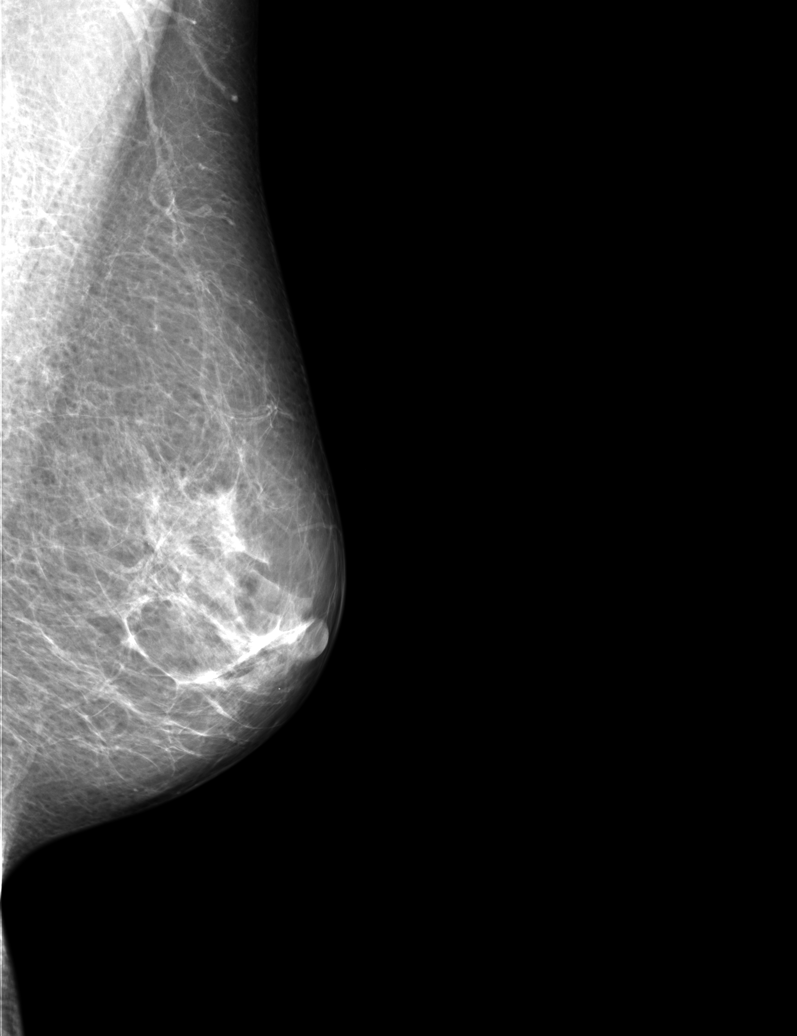
[im 5/5]
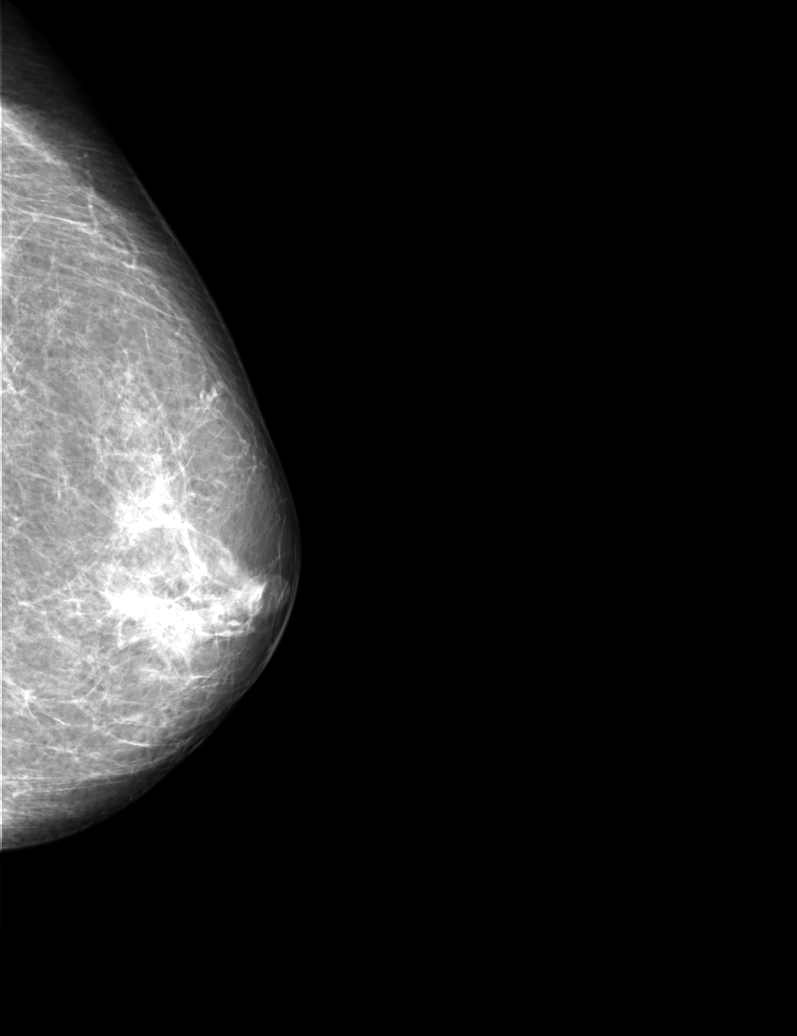

[5 of 5 positions shown; findings below may reference images not displayed]

FINDINGS: There are scattered fibroglandular elements.  There is no mass or suspicious cluster of microcalcifications.  There is no architectural distortion, skin thickening or nipple retraction.
IMPRESSION: BI-RADS 2 – Benign findings.
Recommendations:
Annual screening mammogram as per ACR guidelines is recommended.  
Final Assessment Code:
Bi-Rads 2 

BI-RADS 0
Need additional imaging evaluation
BI-RADS 1
Negative mammogram
BI-RADS 2
Benign finding
BI-RADS 3
Probably benign finding: short-interval follow-up suggested
BI-RADS 4
Suspicious abnormality:  biopsy should be considered
BI-RADS 5
Highly suggestive of malignancy; appropriate action should be taken

NOTE:
In compliance with Federal regulations, the results of this mammogram are being sent to the patient.

________________________________

## 2013-08-27 IMAGING — MG MAMMO DIGITAL SCREENING
1 series · 4 of 4 positions shown · non-contrast
Comparison: 04/13/15 and 04/12/14.

------------- REPORT [id number] -------------
MAMMO DIGITAL SCREENING WITH CAD
Exam:  
Bilateral digital screening mammogram with CAD
HISTORY: Asymptomatic 63 year old with no family history of breast cancer in first degree relatives.

[R CC · oblique · right · 4 of 4 slices shown]
[im 1/4]
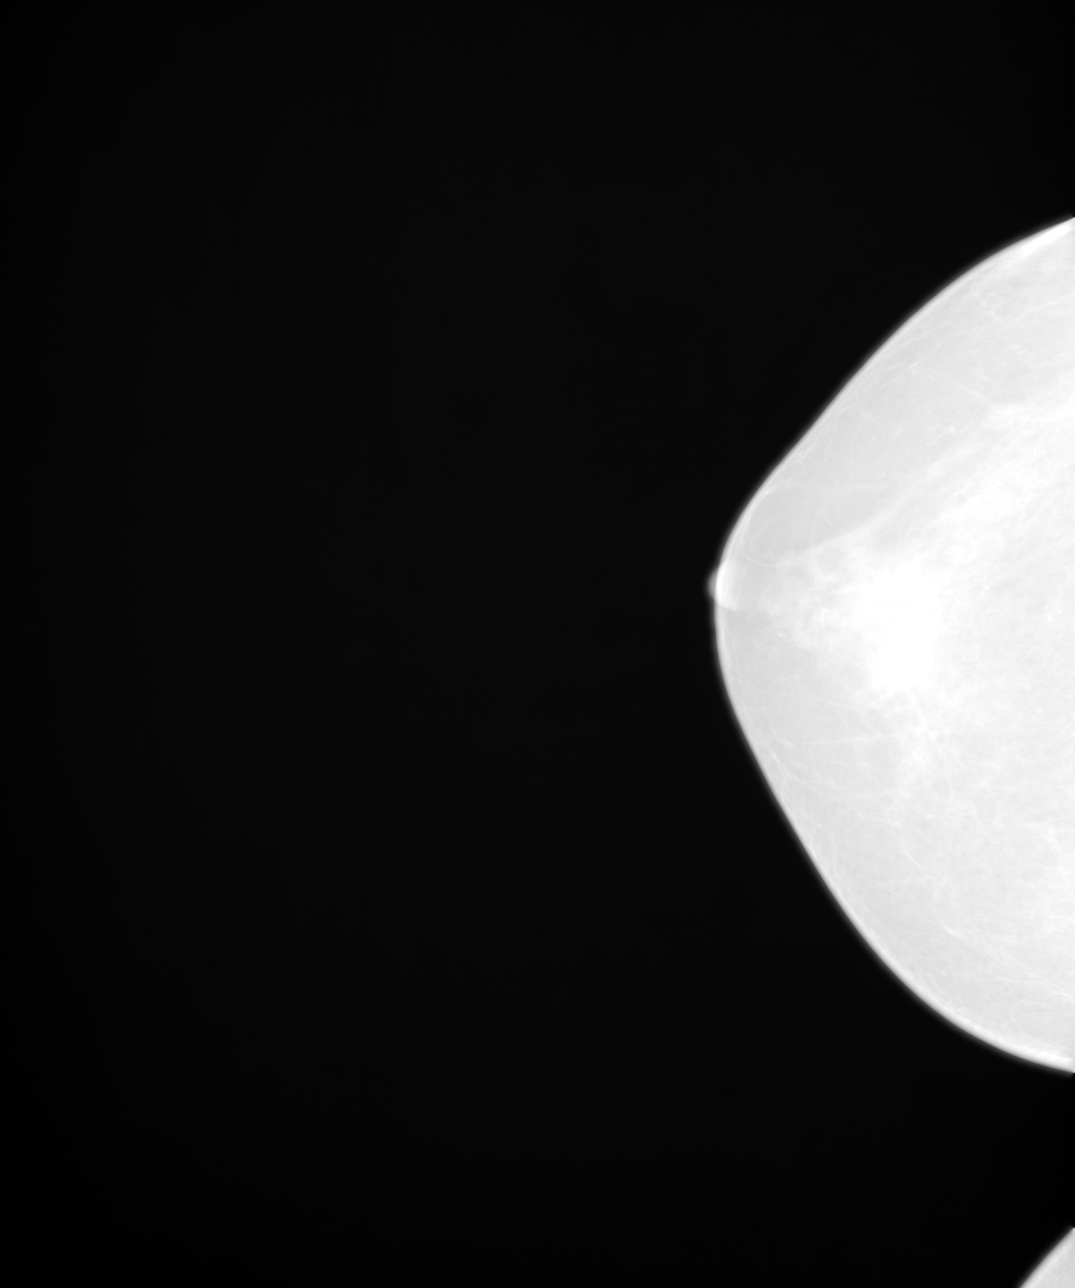
[im 2/4]
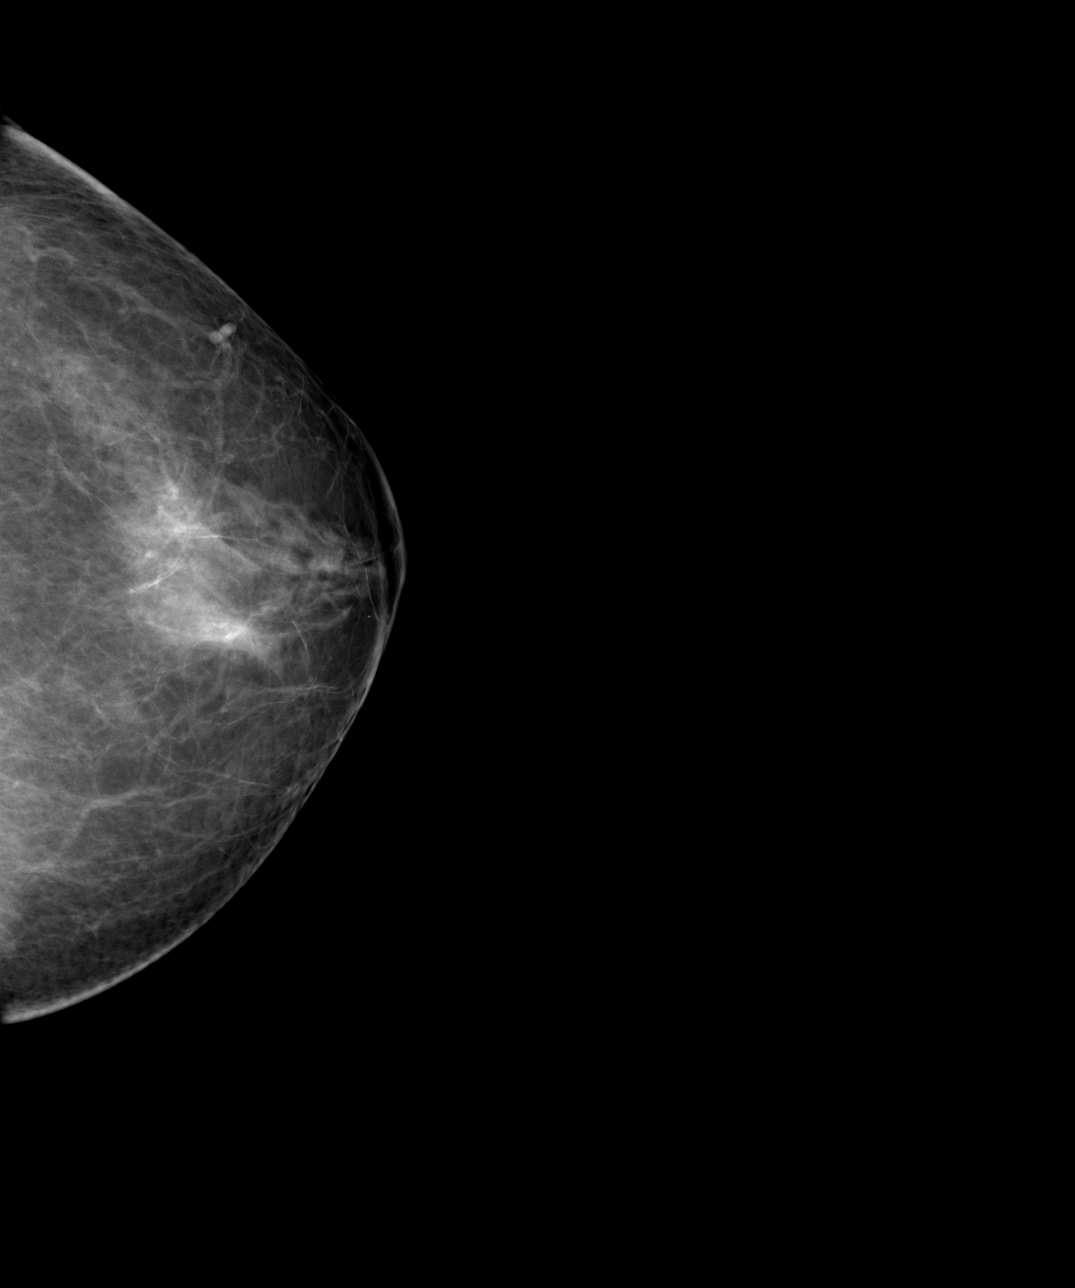
[im 3/4]
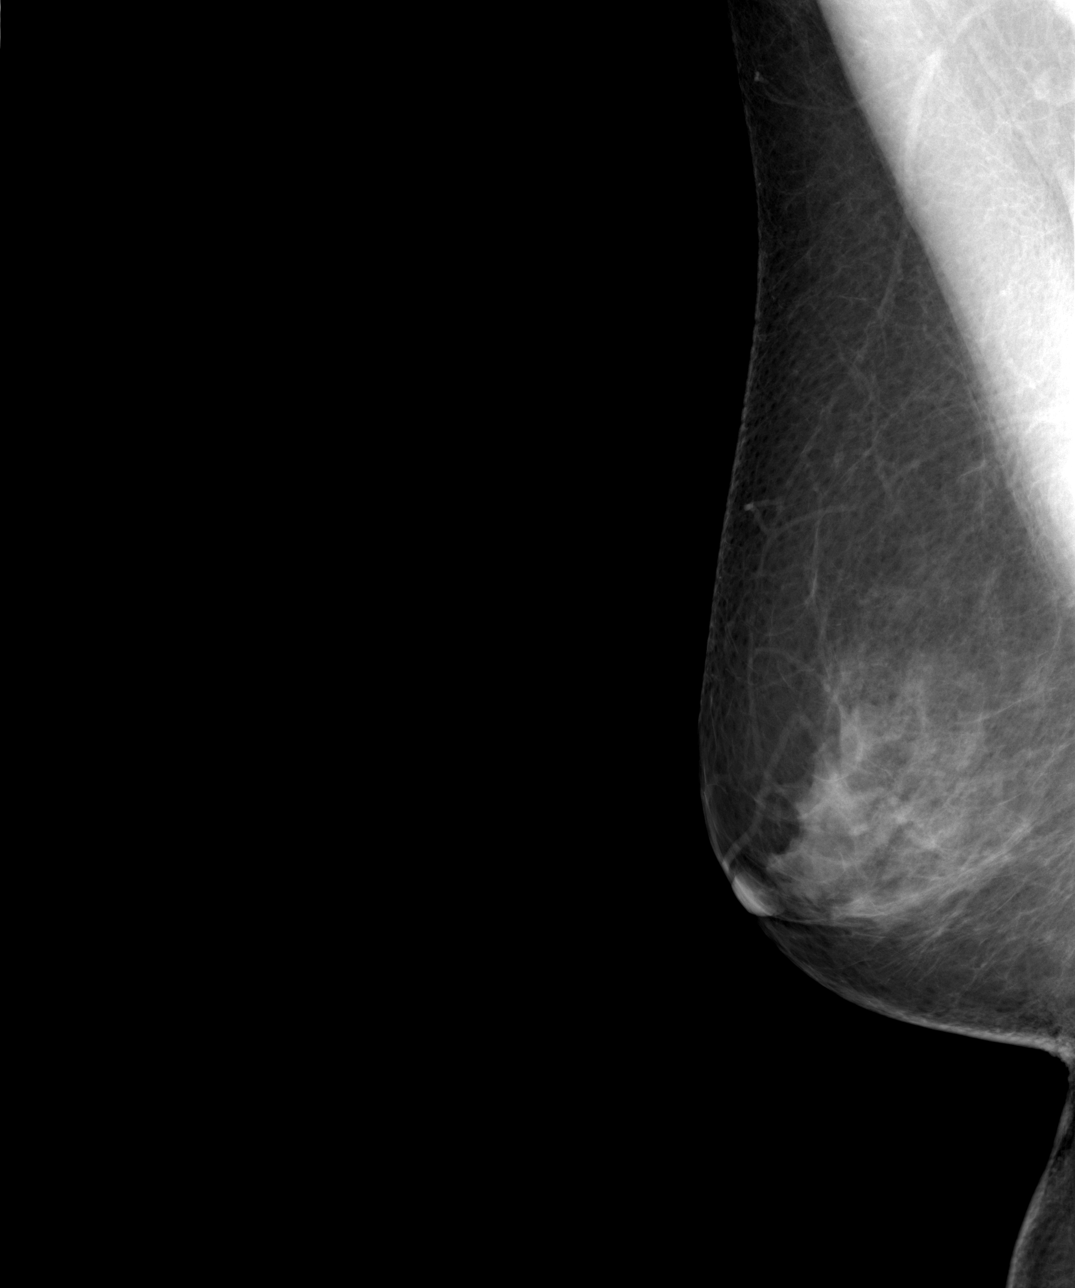
[im 4/4]
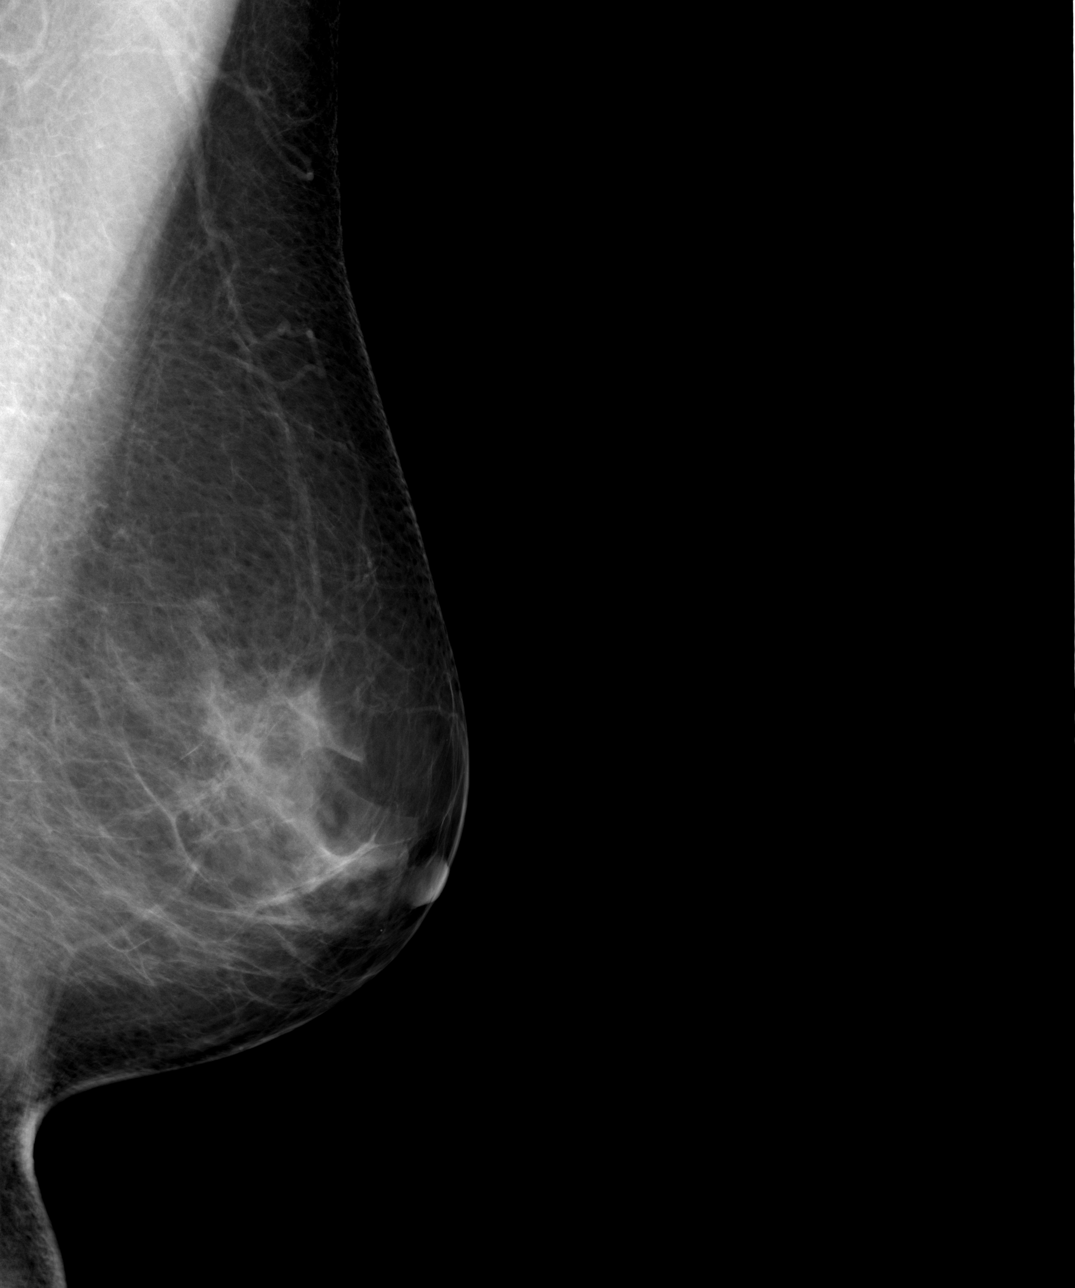

[4 of 4 positions shown; findings below may reference images not displayed]

FINDINGS: Breast tissues are mildly dense. No focal mass or architectural change is seen. No abnormal calcific density, skin change, nipple change or duct dilation is seen.
IMPRESSION: 1.
Stable mammographic findings. 
2.
Clinical and mammographic followup are indicated at 12 months. 

Final Assessment Code:
Bi-Rads 2 

BI-RADS 0
Need additional imaging evaluation
BI-RADS 1
Negative mammogram
BI-RADS 2
Benign finding
BI-RADS 3
Probably benign finding: short-interval follow-up suggested
BI-RADS 4
Suspicious abnormality:  biopsy should be considered
BI-RADS 5
Highly suggestive of malignancy; appropriate action should be taken
BI-RADS 6
Known Biopsy-proven Malignancy  Appropriate action should be taken
NOTE:
In compliance with Federal regulations, the results of this mammogram are being sent to the patient.

------------- REPORT [id number] -------------
[facility]
[address]
We wish to report the following on your recent mammography examination. We are sending a report to your referring physician or other health care provider. 
(       Normal/Negative:
No evidence of cancer.
This statement is mandated by the [place], Department of Health.
Your examination was performed by one of our technologists, who are registered radiological technologists and also specially certified in mammography:
___
Preciado, Brittnay (M)
___
Cipolla, Mark (M)
___
Gule, Tasisios (M)

Your mammogram was interpreted by our radiologist.

( 
Maija-Riitta Chuhan, M.D.

(Annual Breast Examination by a physician or other health care provider
(Annual Mammography Screening beginning at age 40
(Monthly Breast Self Examination

## 2014-08-31 IMAGING — MG MAMMO DIGITAL SCREENING
1 series · 4 of 4 positions shown · non-contrast
Comparison: 

------------- REPORT [id number] -------------
MAMMO DIGITAL SCREENING WITH CAD
Exam:  

Screening digital mammogram with CAD
INDICATION: Annual screening.

[R CC · oblique · right · 4 of 4 slices shown]
[im 1/4]
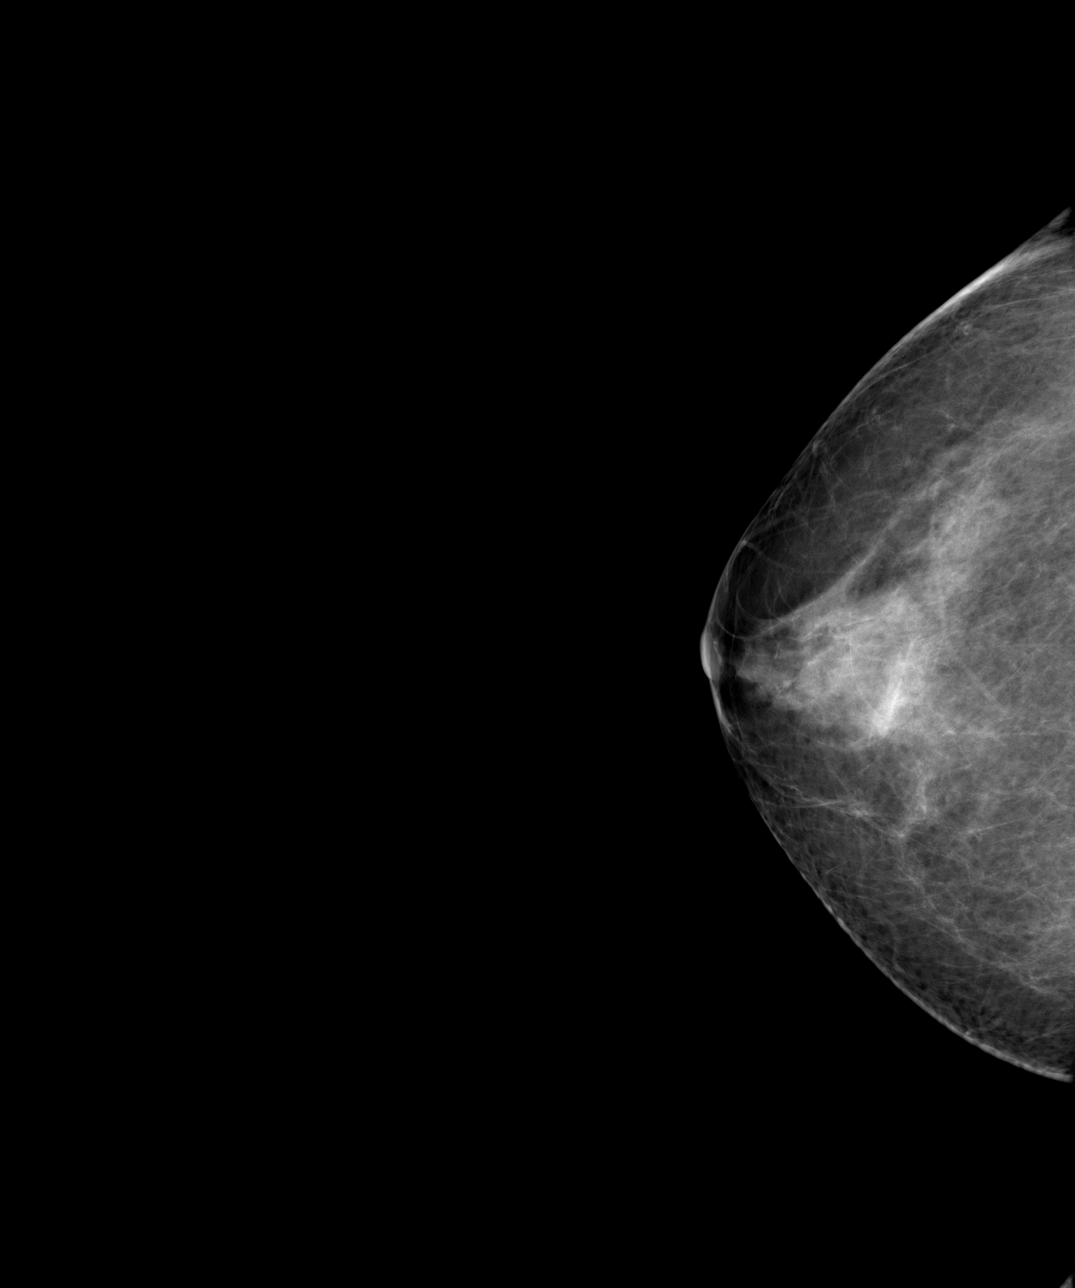
[im 2/4]
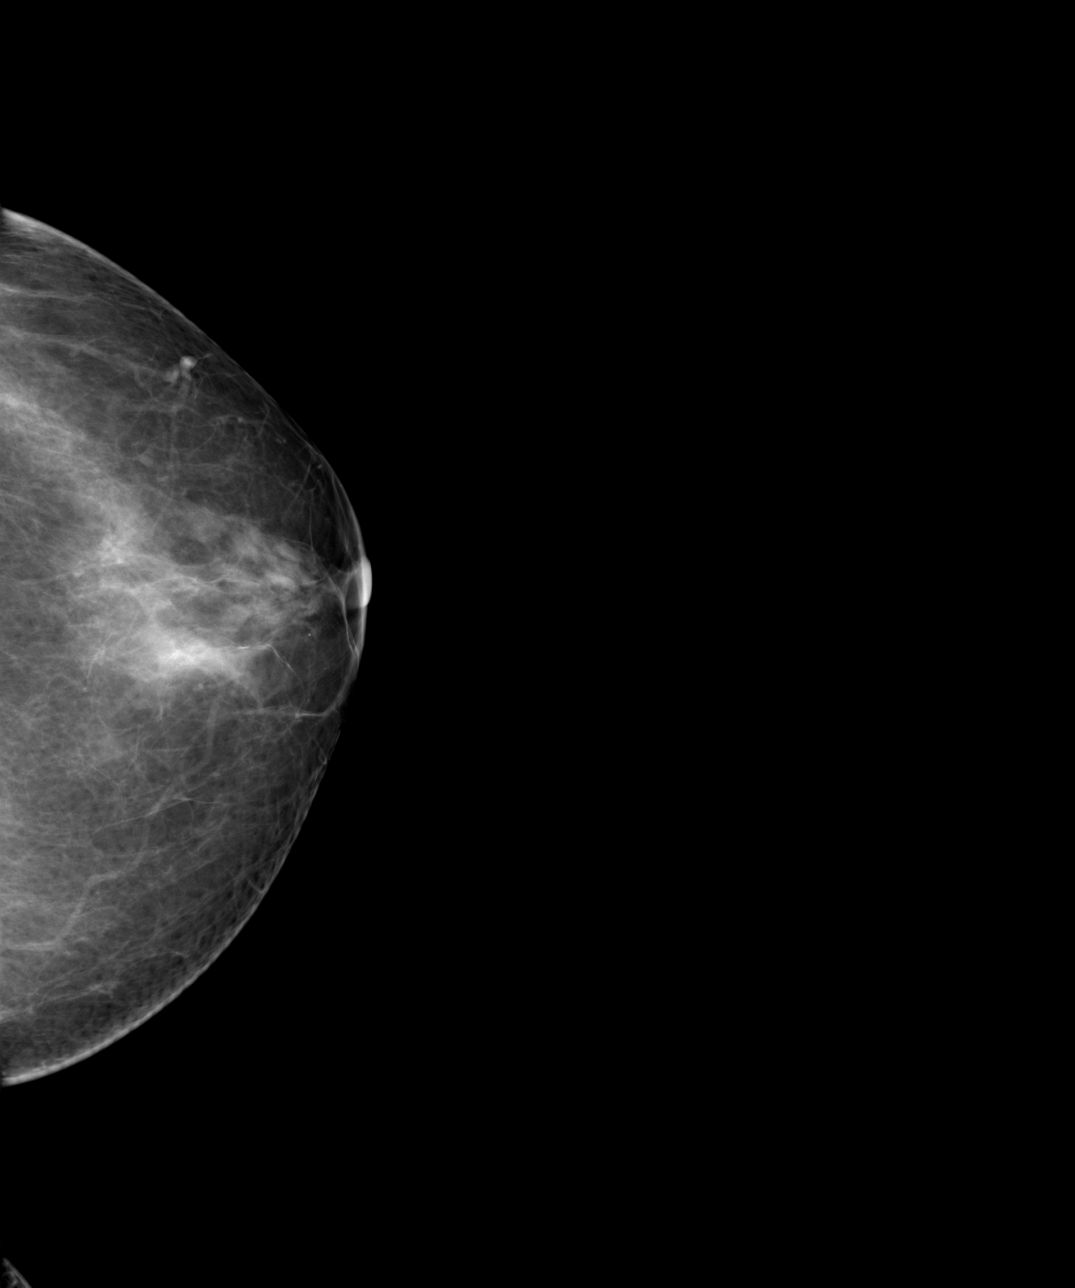
[im 3/4]
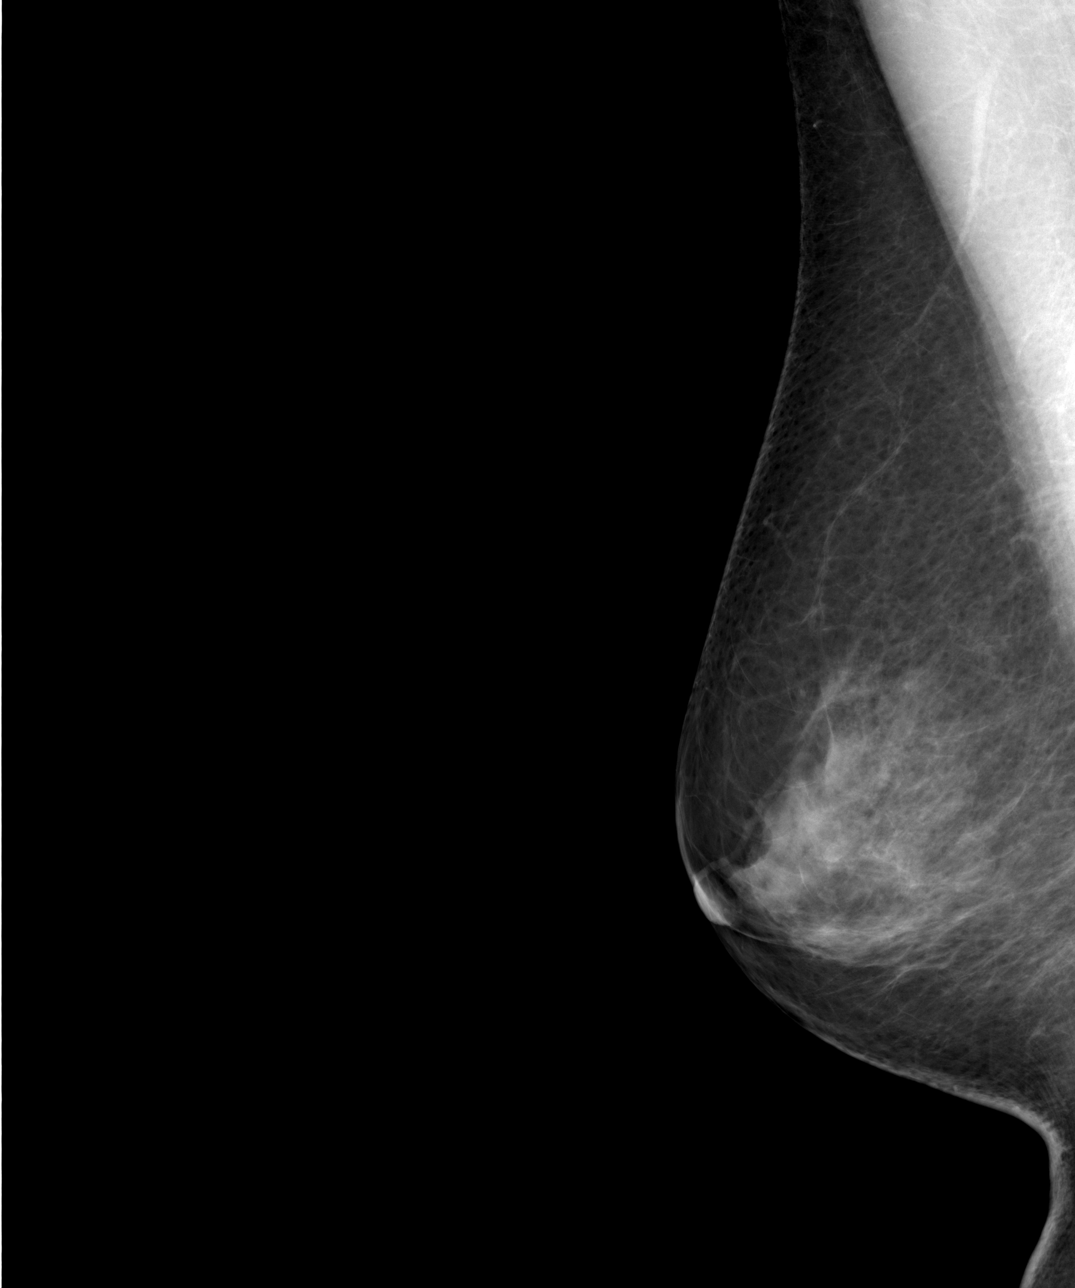
[im 4/4]
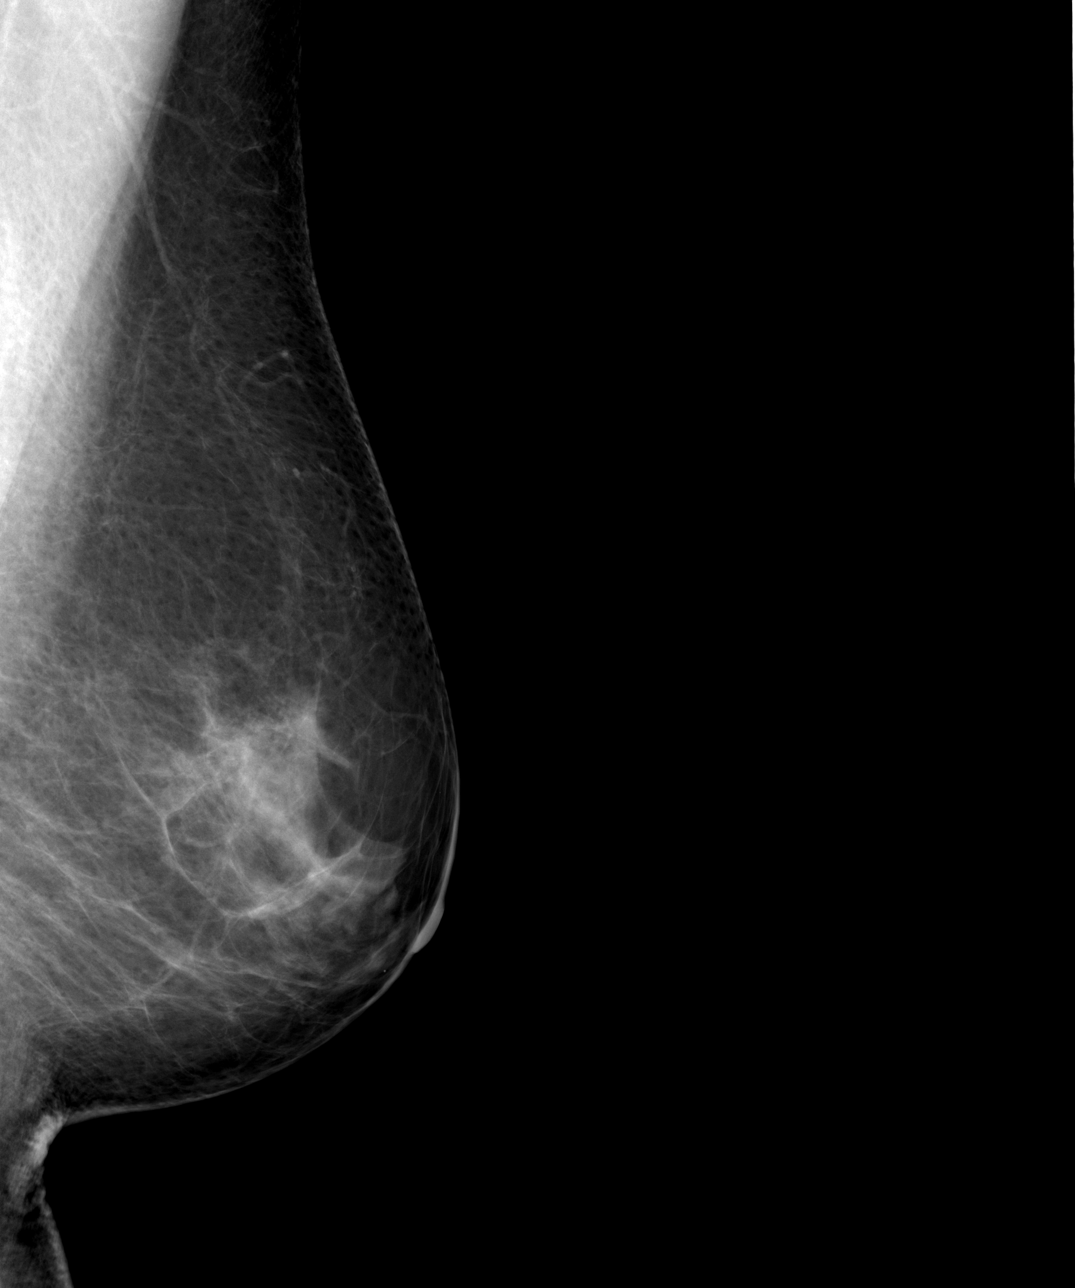

[4 of 4 positions shown; findings below may reference images not displayed]

FINDINGS: There are scattered fibroglandular elements. There is no mass or suspicious cluster of microcalcifications. There is no architectural distortion, skin thickening or nipple retraction.
IMPRESSION: 1.
BIRADS 2-Benign findings. Patient has been added in a reminder system with a
target date for the next screening mammography.
2.
DENSITY CODE   B (Scattered areas of fibroglandular density)
Final Assessment Code:
Bi-Rads 2 

BI-RADS 0
Need additional imaging evaluation
BI-RADS 1
Negative mammogram
BI-RADS 2
Benign finding
BI-RADS 3
Probably benign finding: short-interval follow-up suggested
BI-RADS 4
Suspicious abnormality:  biopsy should be considered
BI-RADS 5
Highly suggestive of malignancy; appropriate action should be taken
BI-RADS 6
Known Biopsy-proven Malignancy  Appropriate action should be taken
NOTE:
In compliance with Federal regulations, the results of this mammogram are being sent to the patient.

------------- REPORT [id number] -------------
[facility]
[address]
We wish to report the following on your recent mammography examination. We are sending a report to your referring physician or other health care provider. 
(       Normal/Negative:
No evidence of cancer.
This statement is mandated by the [place], Department of Health.
Your examination was performed by one of our technologists, who are registered radiological technologists and also specially certified in mammography:
___
Preciado, Brittnay (M)
___
Cipolla, Mark (M)
___
Gule, Tasisios (M)

Your mammogram was interpreted by our radiologist.

( 
Maija-Riitta Chuhan, M.D.

(Annual Breast Examination by a physician or other health care provider
(Annual Mammography Screening beginning at age 40
(Monthly Breast Self Examination

## 2015-09-01 IMAGING — MG 2D SCREENING DIGITAL MAMMOGRAM WITH CAD
5 series · 9 of 24 positions shown · non-contrast
Comparison: 03/31/2017 and 03/27/2016.

------------- REPORT [id number] -------------
[facility]
[address]
We wish to report the following on your recent mammography examination. We are sending a report to your referring physician or other health care provider. 
(       Normal/Negative:
No evidence of cancer.
This statement is mandated by the [place], Department of Health.
Your examination was performed by one of our technologists, who are registered radiological technologists and also specially certified in mammography:
___
Frank, Zarina (M)
Huidrom, Rutik (M)

Your mammogram was interpreted by our radiologist.
( 
Jean Rousso Aniece, M.D.
(Annual Breast Examination by a physician or other health care provider
(Annual Mammography Screening beginning at age 40
(Monthly Breast Self Examination
------------- REPORT [id number] -------------
Exam:  
Annual screening digital mammogram with 3D Tomosynthesis with CAD
INDICATION: Screening.

[Series 2525: R CC · right · 0.10mm/px · 2 of 2 slices shown]
[im 1/2]
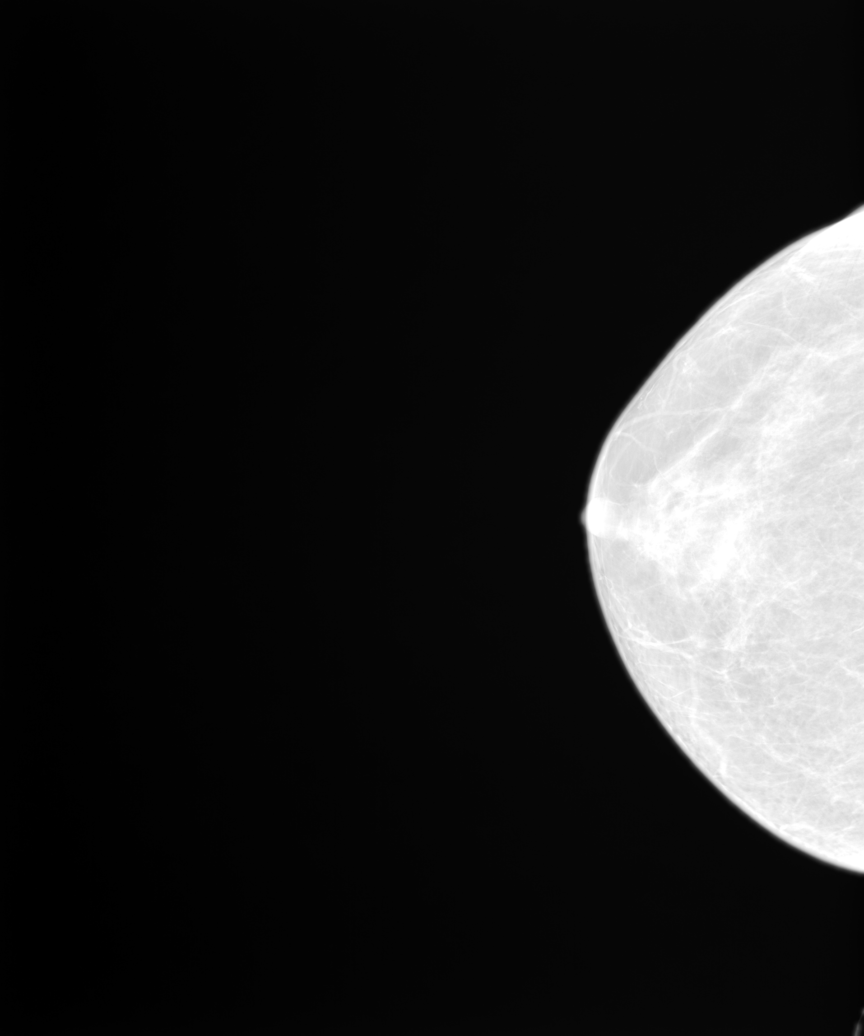
[im 2/2]
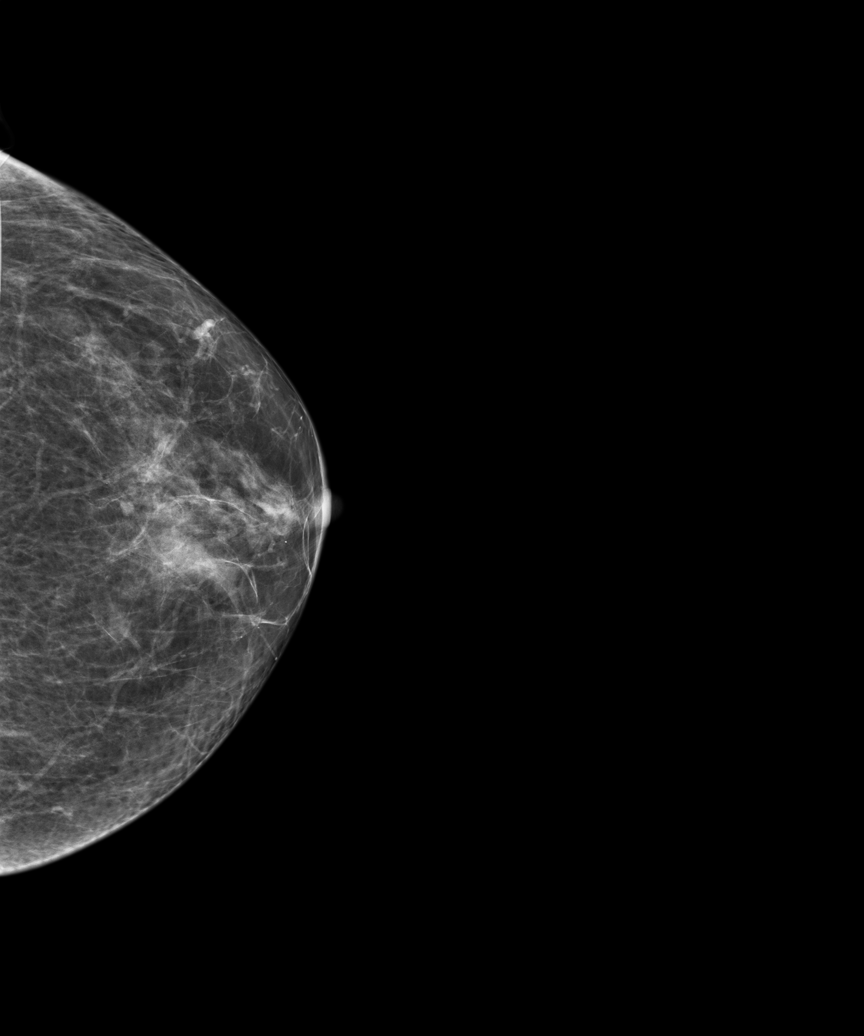

[Series 2527: 2D SCREENING DIGITAL MAMMOGRAM WITH CAD · 2 acquisitions, 4 frames shown (1 of 2)]
[im 1/2]
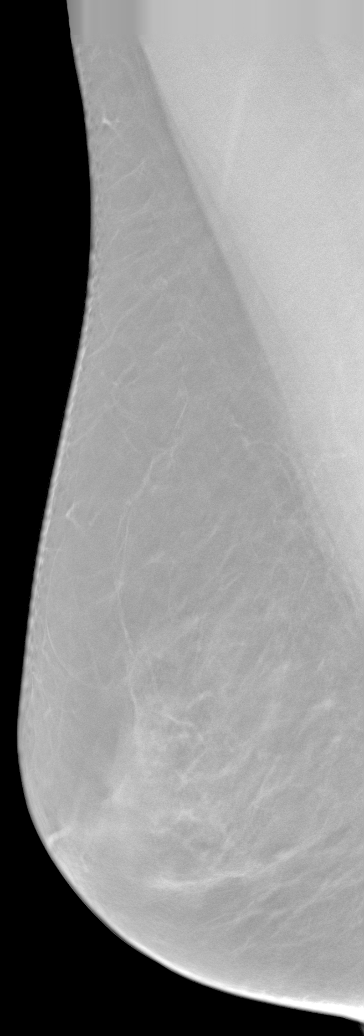
[im 1/2]
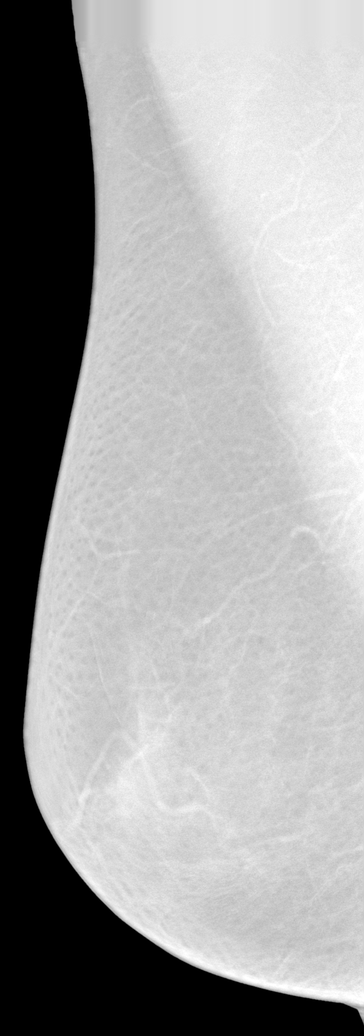
[im 2/2]
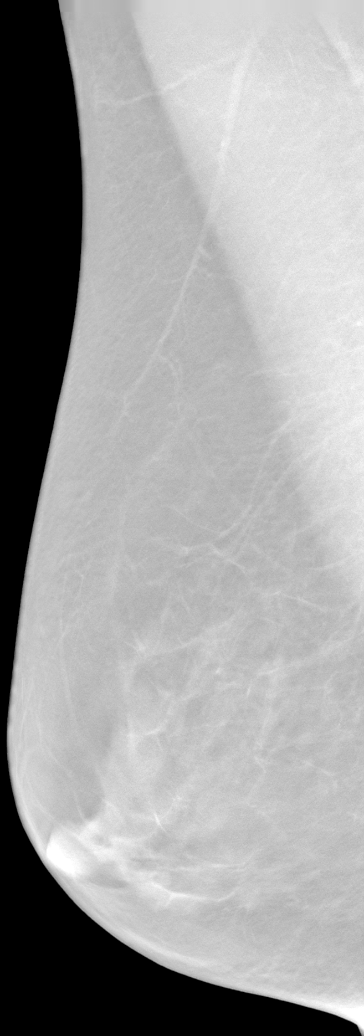
[im 2/2]
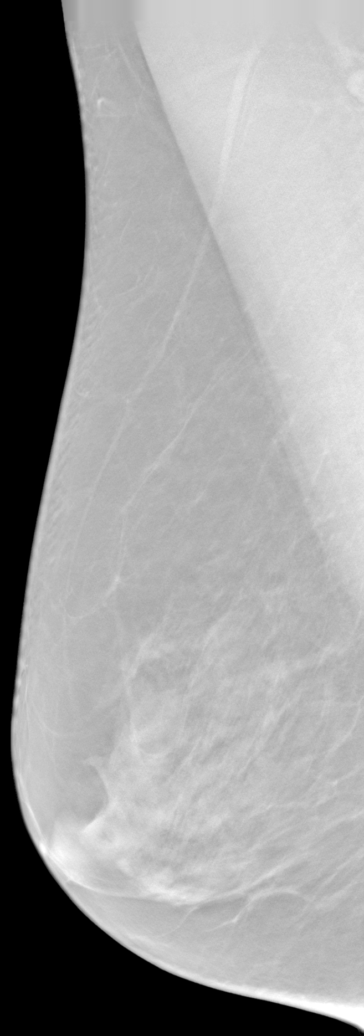

[R]
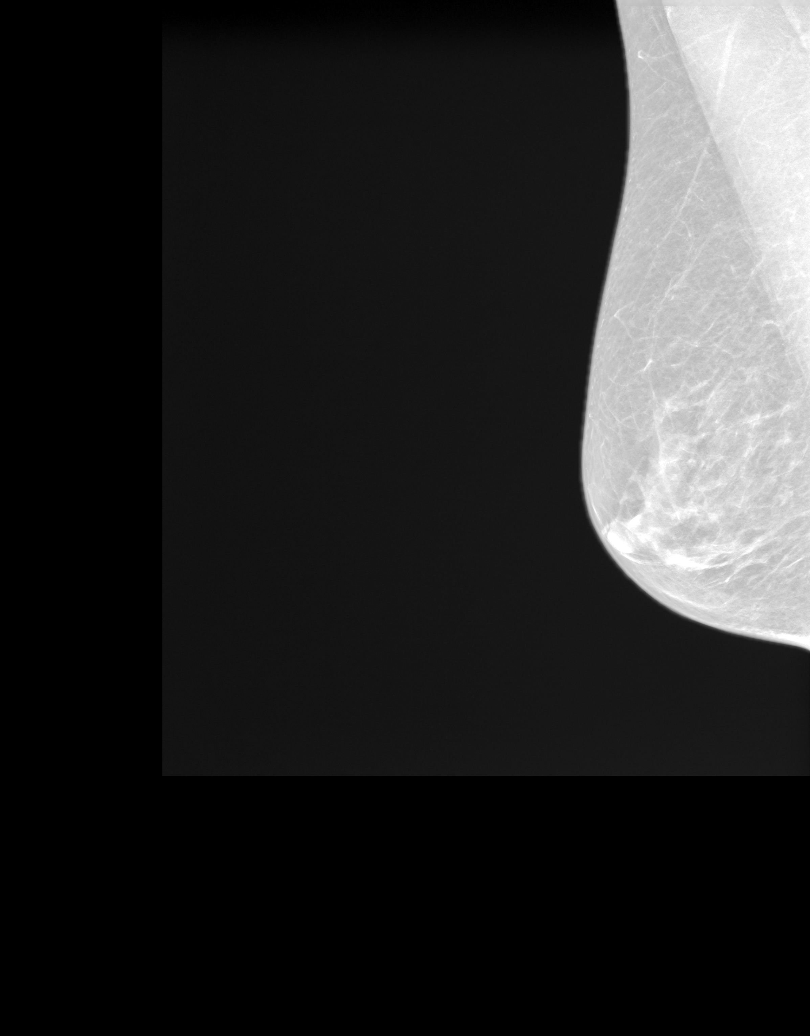

[2D SCREENING DIGITAL MAMMOGRAM WITH CAD (2 of 2) · tomo slice 7/43.0]
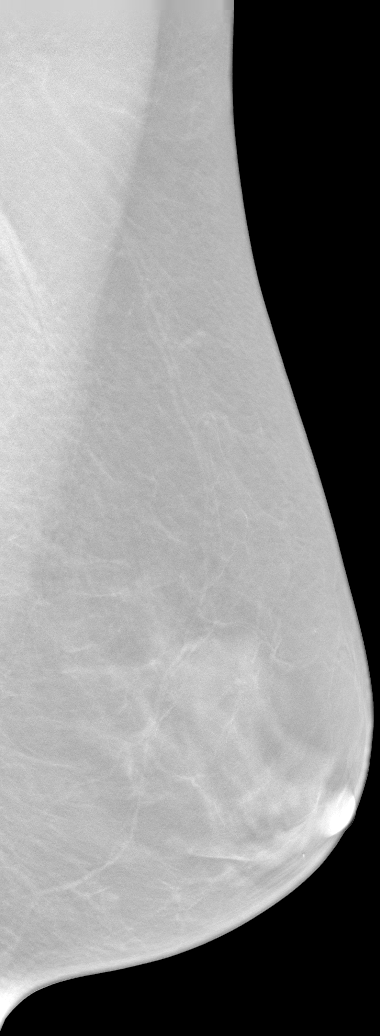

[L]
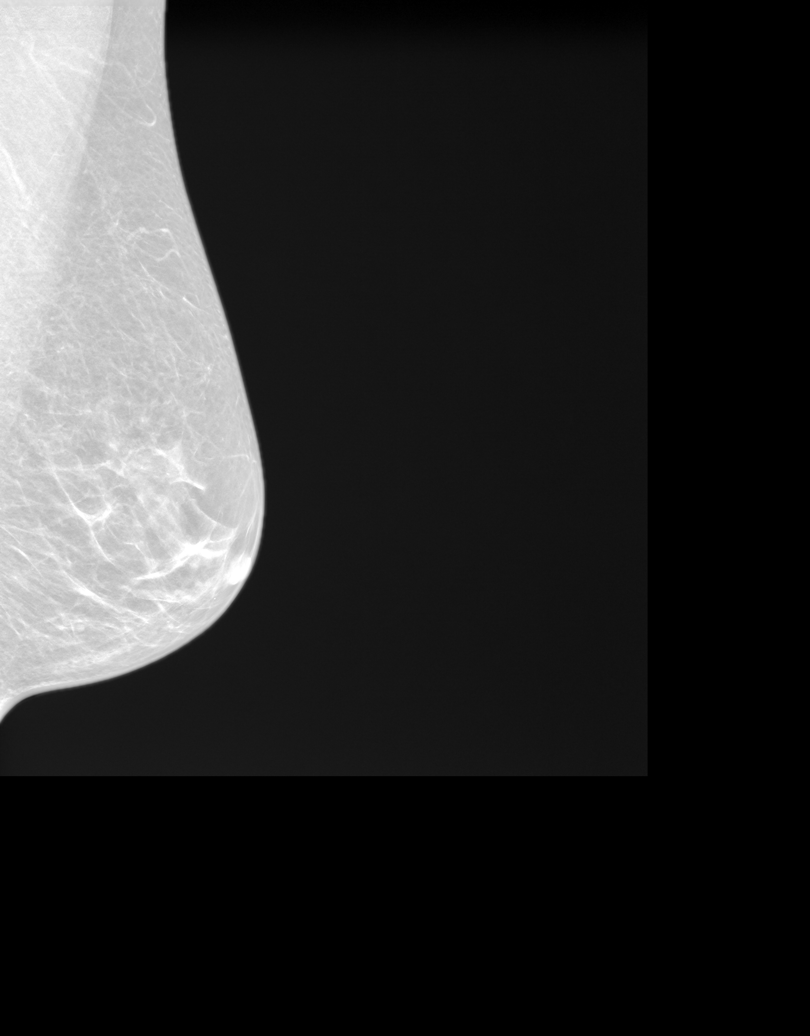

[9 of 24 positions shown; findings below may reference images not displayed]

FINDINGS: There are scattered fibroglandular elements. There is no mass or suspicious cluster of microcalcifications. There is no architectural distortion, skin thickening or nipple retraction.
IMPRESSION: BIRADS 2-Benign findings. Patient has been added in a reminder system with a

target date for the next screening mammography.

DENSITY CODE –  B (Scattered areas of fibroglandular density). 

Final Assessment Code:

Bi-Rads 2 

BI-RADS 0
Need additional imaging evaluation

BI-RADS 1
Negative mammogram

BI-RADS 2
Benign finding

BI-RADS 3
Probably benign finding: short-interval follow-up suggested

BI-RADS 4
Suspicious abnormality:  biopsy should be considered

BI-RADS 5
Highly suggestive of malignancy; appropriate action should be taken

BI-RADS 6
Known Biopsy-proven Malignancy – Appropriate action should be taken

NOTE:
In compliance with Federal regulations, the results of this mammogram are being sent to the patient.

## 2016-10-01 IMAGING — MG 3D SCREENING MAMMO BIL W/CAD
5 series · 8 of 24 positions shown · non-contrast
Comparison: 01/28/2018 and 01/27/2017.

------------- REPORT [id number] -------------
PUNJENA PLACKOV,PA

Exam:  
Annual screening digital mammogram with 3D Tomosynthesis with CAD
INDICATION: Screening.

[R]
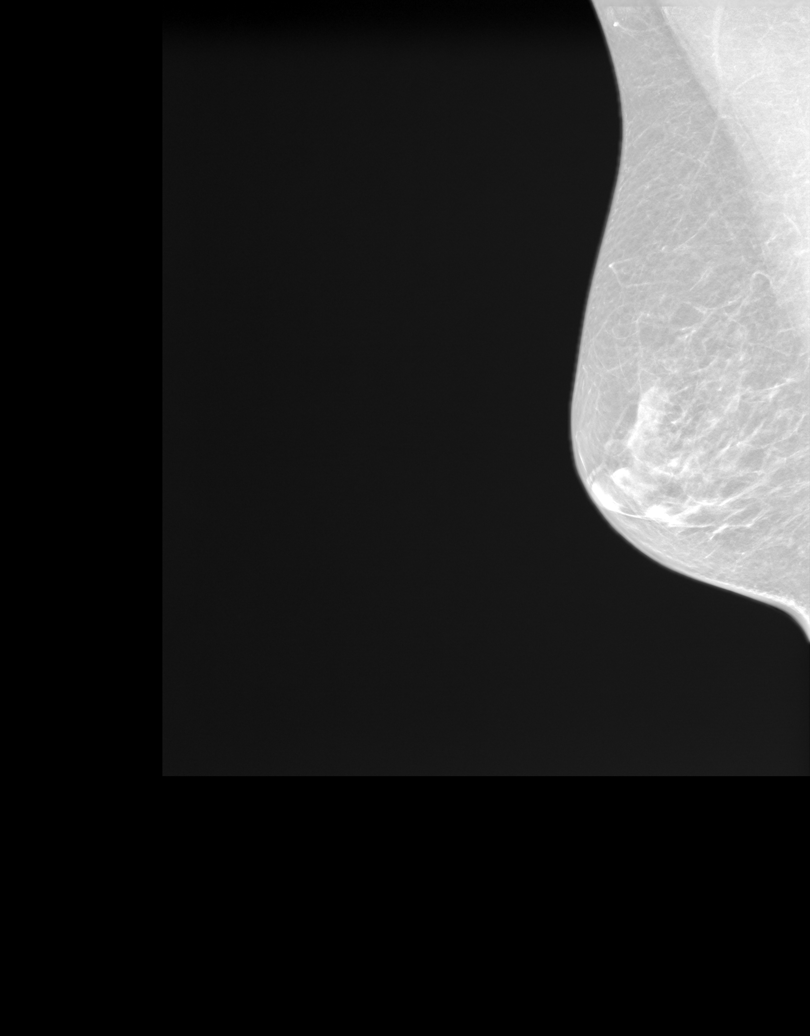

[R CC · right · 0.10mm/px · 2 of 2 slices shown]
[im 1/2]
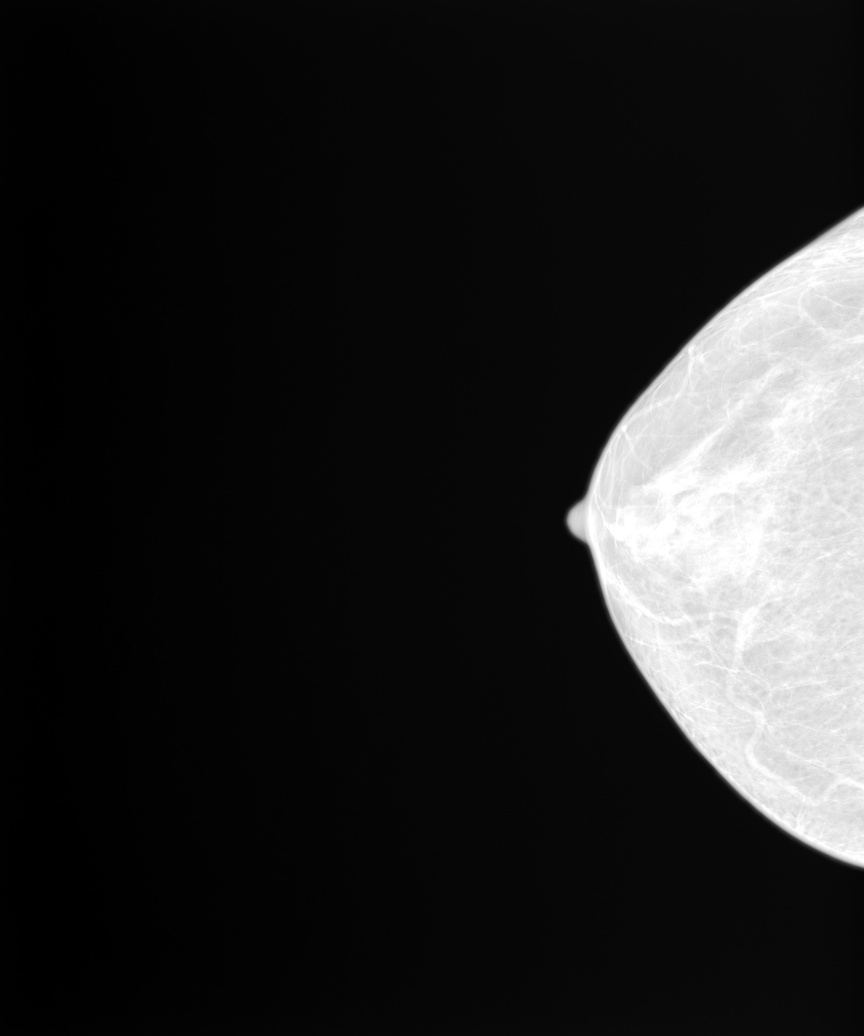
[im 2/2]
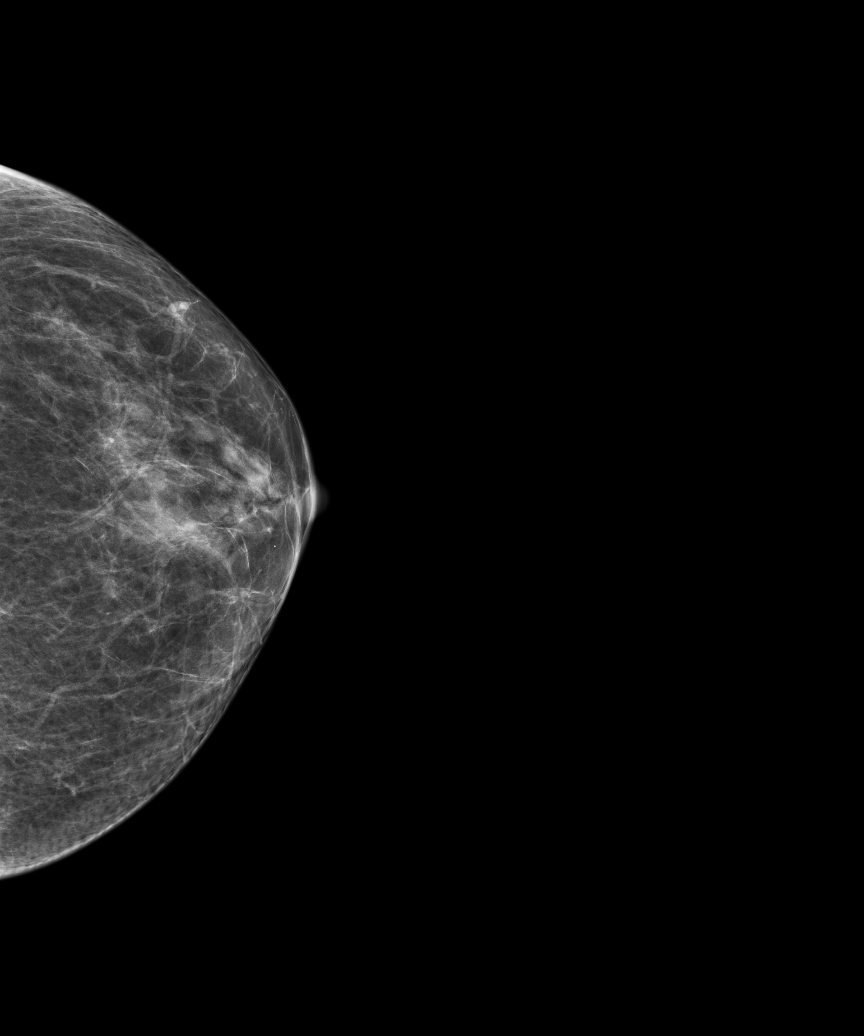

[3D SCREENING MAMMO BIL W/CAD · 2 acquisitions, 3 frames shown (1 of 2)]
[im 1/2]
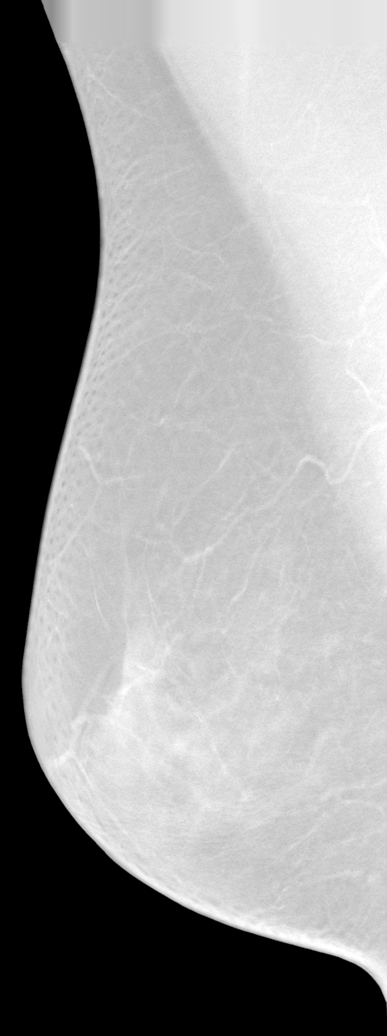
[im 2/2]
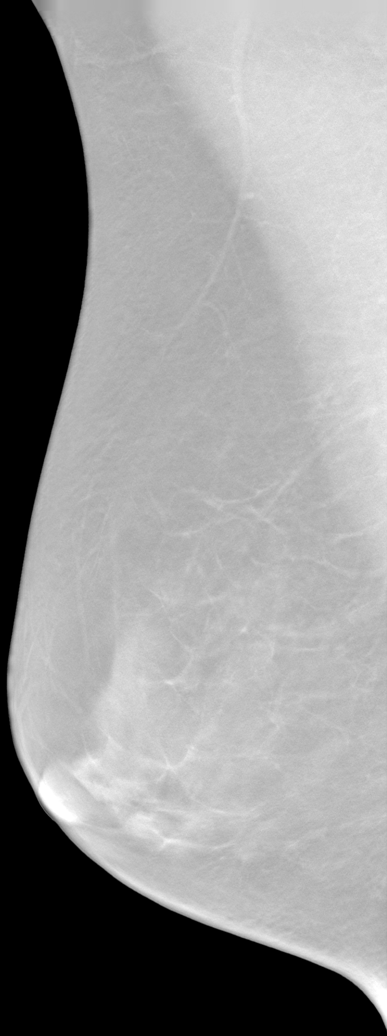
[im 2/2]
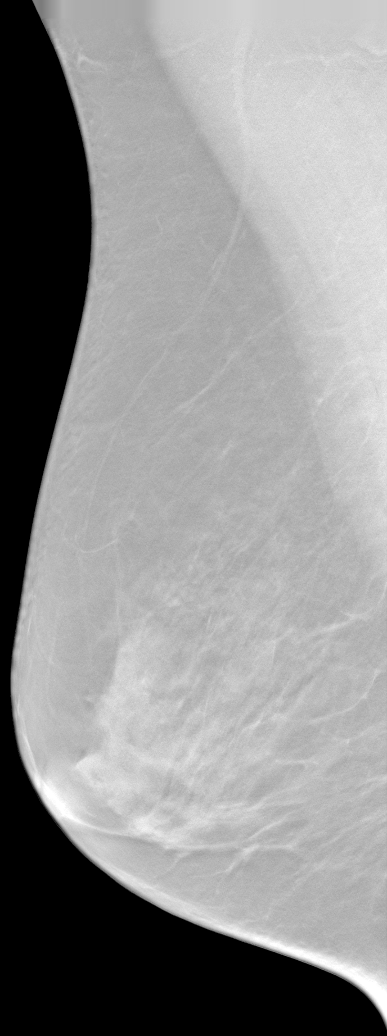

[3D SCREENING MAMMO BIL W/CAD (2 of 2) · tomo slice 7/42.0]
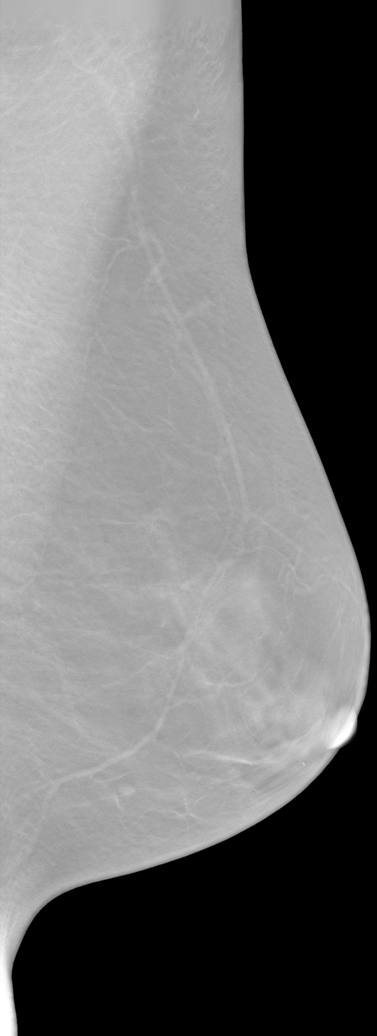

[L]
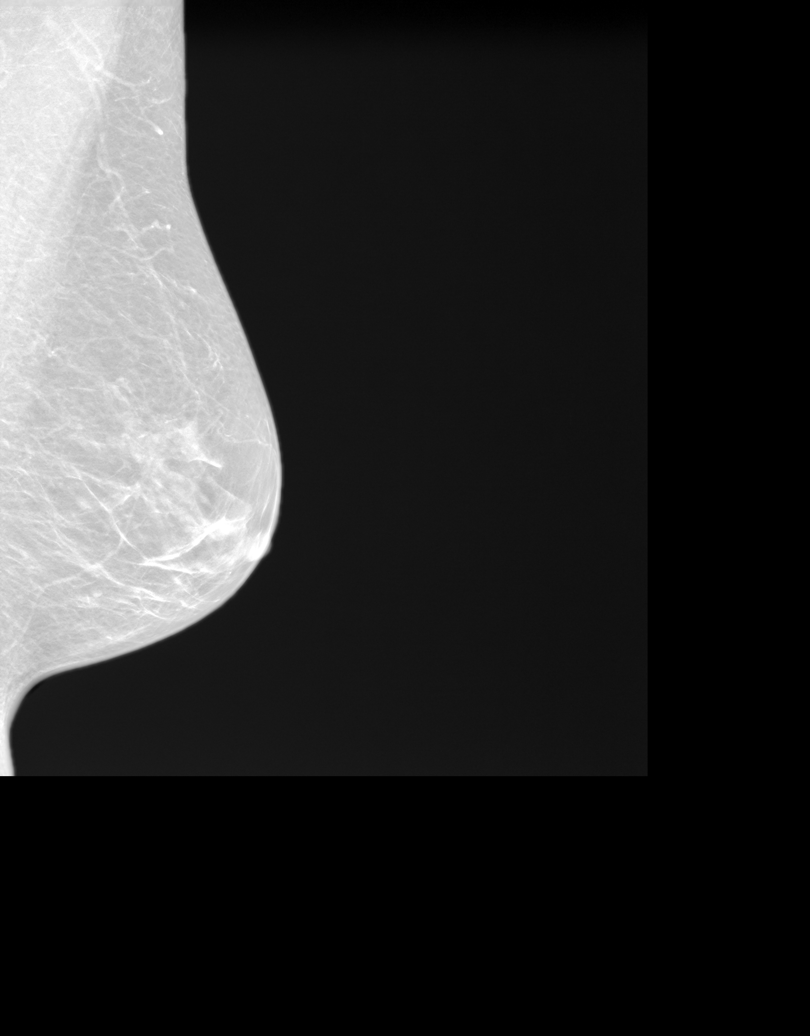

[8 of 24 positions shown; findings below may reference images not displayed]

FINDINGS: There are scattered fibroglandular elements. There is no mass or suspicious cluster of microcalcifications. There is no architectural distortion, skin thickening or nipple retraction.
IMPRESSION: BIRADS 2-Benign findings. Patient has been added in a reminder system with a

target date for the next screening mammography.

DENSITY CODE –  B (Scattered areas of fibroglandular density). 

Final Assessment Code:

Bi-Rads 2 

BI-RADS 0
Need additional imaging evaluation

BI-RADS 1
Negative mammogram

BI-RADS 2
Benign finding

BI-RADS 3
Probably benign finding: short-interval follow-up suggested

BI-RADS 4
Suspicious abnormality:  biopsy should be considered

BI-RADS 5
Highly suggestive of malignancy; appropriate action should be taken

BI-RADS 6
Known Biopsy-proven Malignancy – Appropriate action should be taken

NOTE:
In compliance with Federal regulations, the results of this mammogram are being sent to the patient.

------------- REPORT [id number] -------------
[facility]
[address]
We wish to report the following on your recent mammography examination. We are sending a report to your referring physician or other health care provider. 
(       Normal/Negative:
No evidence of cancer.
This statement is mandated by the [place], Department of Health.
Your examination was performed by one of our technologists, who are registered radiological technologists and also specially certified in mammography:
___
Frank, Zarina (M)
___
Huidrom, Rutik (M)

Your mammogram was interpreted by our radiologist.

( 
Jean Rousso Aniece, M.D.

(Annual Breast Examination by a physician or other health care provider
(Annual Mammography Screening beginning at age 40
(Monthly Breast Self Examination

## 2017-10-03 IMAGING — MG 3D SCREENING MAMMO BIL W/CAD
5 series · 8 of 24 positions shown · non-contrast
Comparison: 12/24/2018

------------- REPORT [id number] -------------
PATIENT REGISTRATION FORM

Scheduled Modality:
MG
Date Scheduled:
Referring Physician: 
PATIENT INFORMATION
 Mr.
 Mrs.
 Miss  Ms.
Email address:
Social Security:  
Age:
Sex:
F
Mailing Address:
Home Phone
[place] 
[place] [address]
Study Type:
Diagnosis / Symptoms:
Insurance Authorization:
[id number]
Notes/Special Instructions: 
INSURANCE INFORMATION
(Please give your insurance card to the receptionist.)
Name of  primary insurance
[employer]
Subscriber’s S.S. #
Group #
Insurance ID # 
Co-payment:
$
Patient’s relationship to subscriber:
 Self
 Spouse
 Child
 Other
Name of secondary insurance (if applicable):
Insurance ID #
[employer] [place]
IN CASE OF EMERGENCY
Name of friend or relative to call in case of emergency:
Relationship to patient:
Home phone #
Work phone #
The above information is true to the best of my knowledge. I authorize my insurance benefits be paid directly to the physician. I understand that I am financially responsible for any balance. I also authorize [employer] or insurance company to release any information required to process my claims.
Patient/Guardian signature
Date
------------- REPORT [id number] -------------
ENTENG CLACIO,PA
EXAM:  3D BILATERAL ANNUAL SCREENING DIGITAL MAMMOGRAM WITH TOMOSYNTHESIS AND CAD
INDICATION: Screening.

[Series 5947: R CC · right · 0.10mm/px · 2 of 2 slices shown]
[im 1/2]
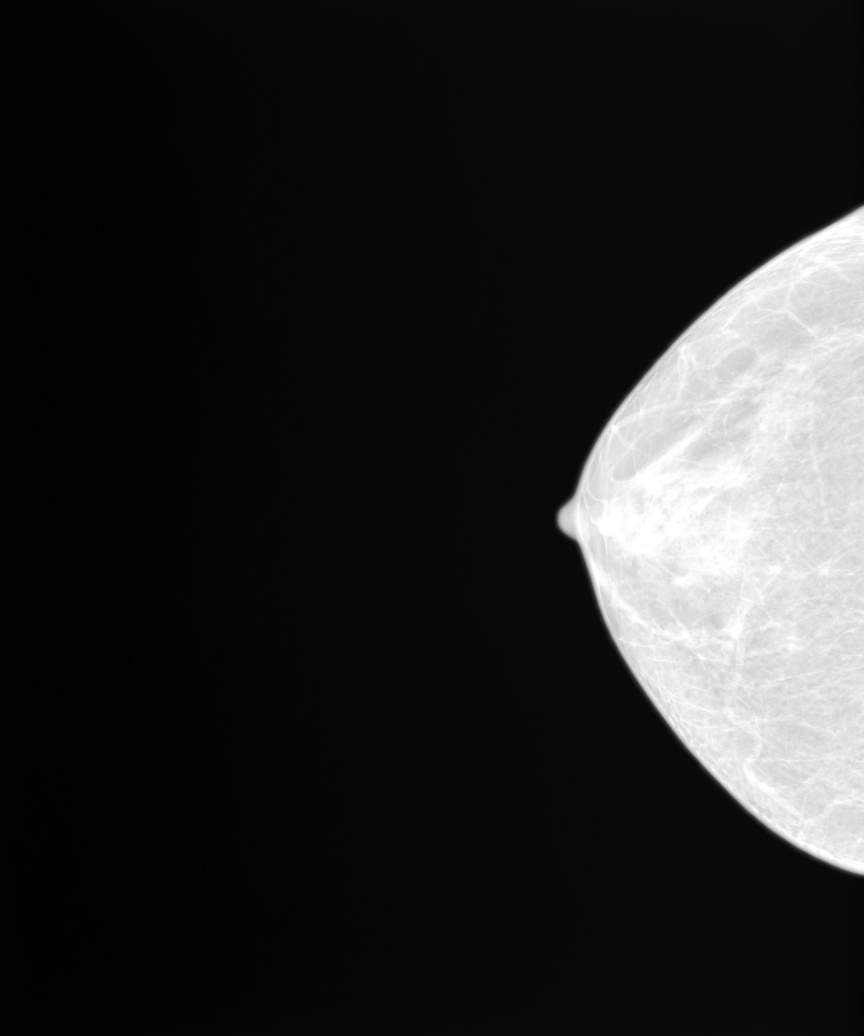
[im 2/2]
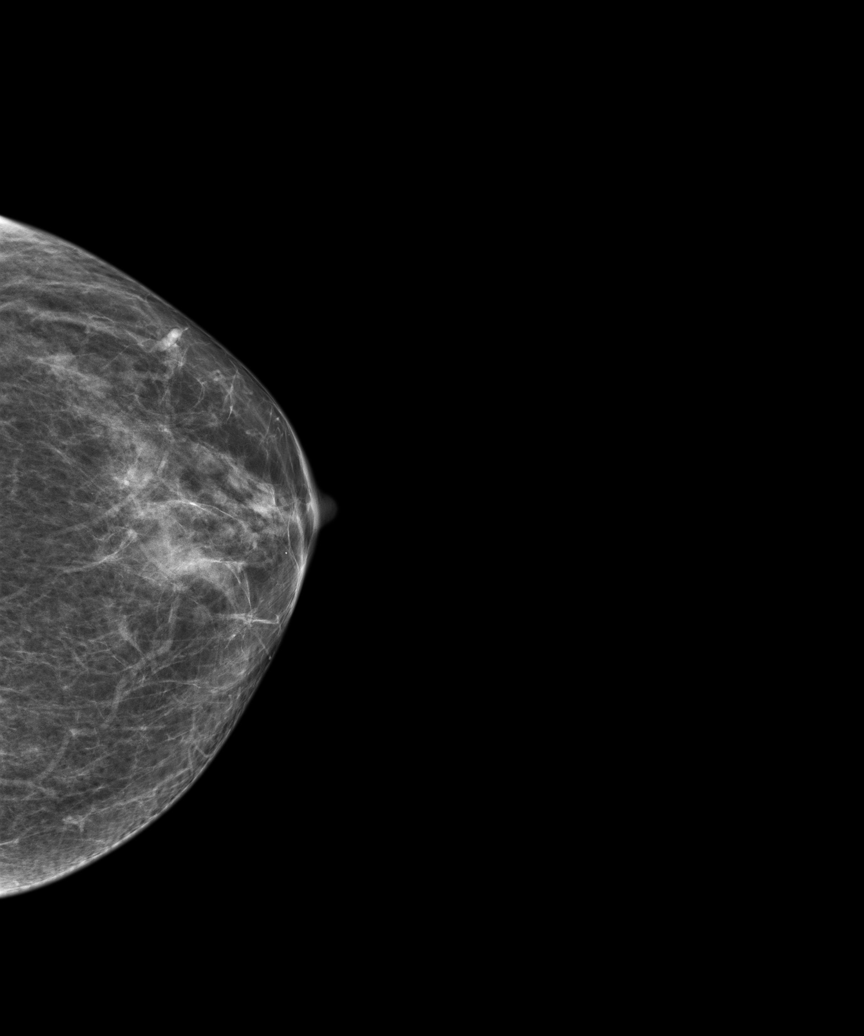

[Series 5949: 3D SCREENING MAMMO BIL W/CAD · 2 acquisitions, 3 frames shown (1 of 2)]
[im 1/2]
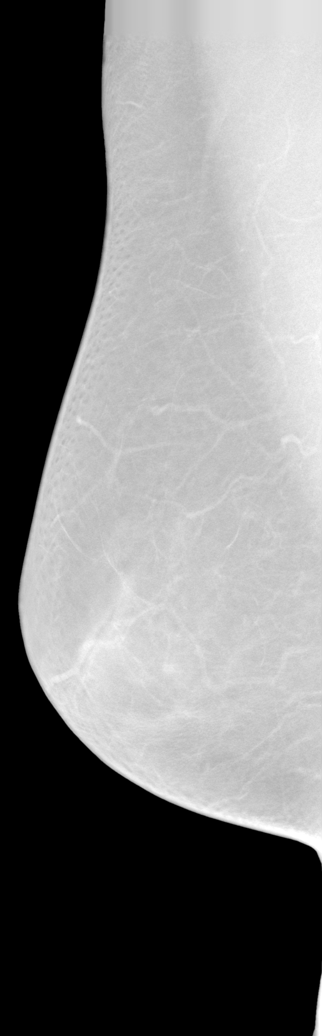
[im 2/2]
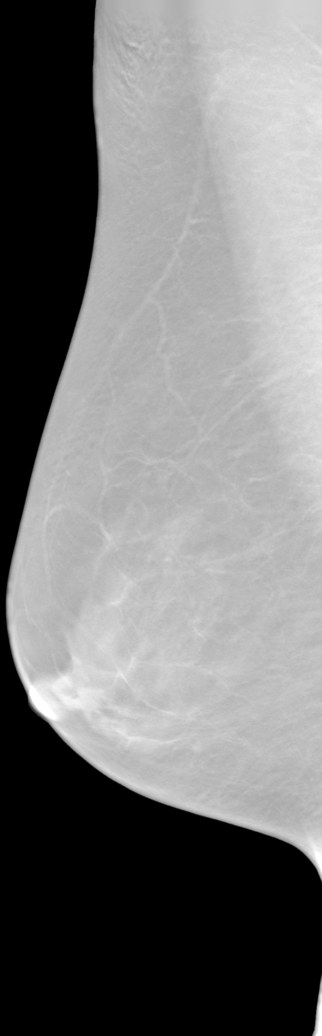
[im 2/2]
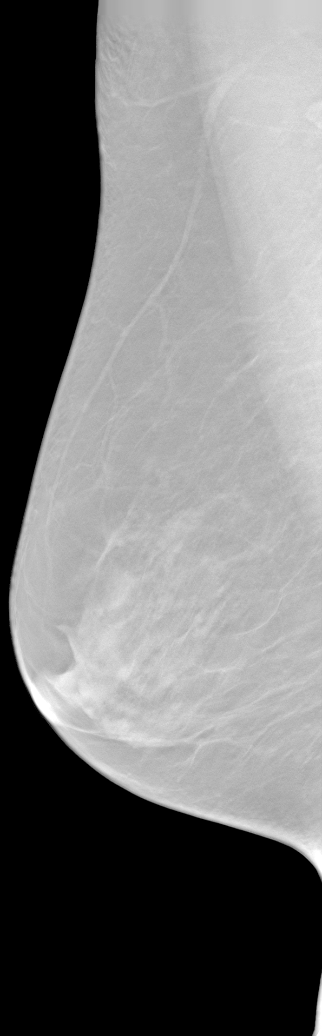

[3D SCREENING MAMMO BIL W/CAD (2 of 2) · tomo slice 7/41.0]
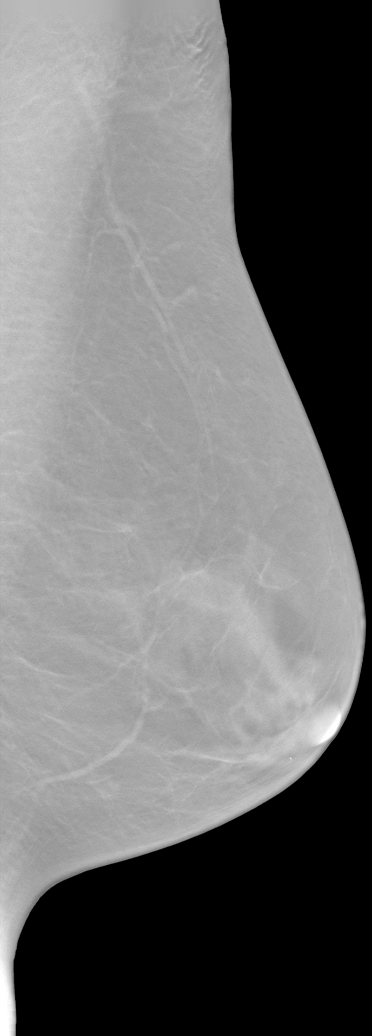

[R]
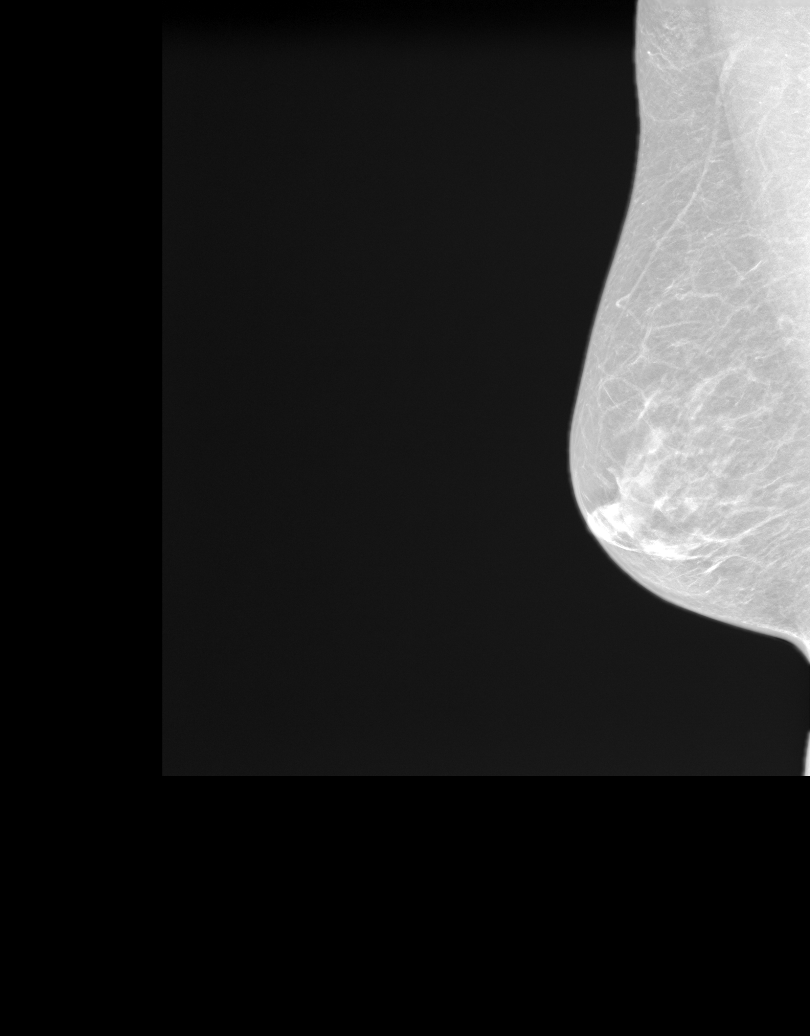

[L]
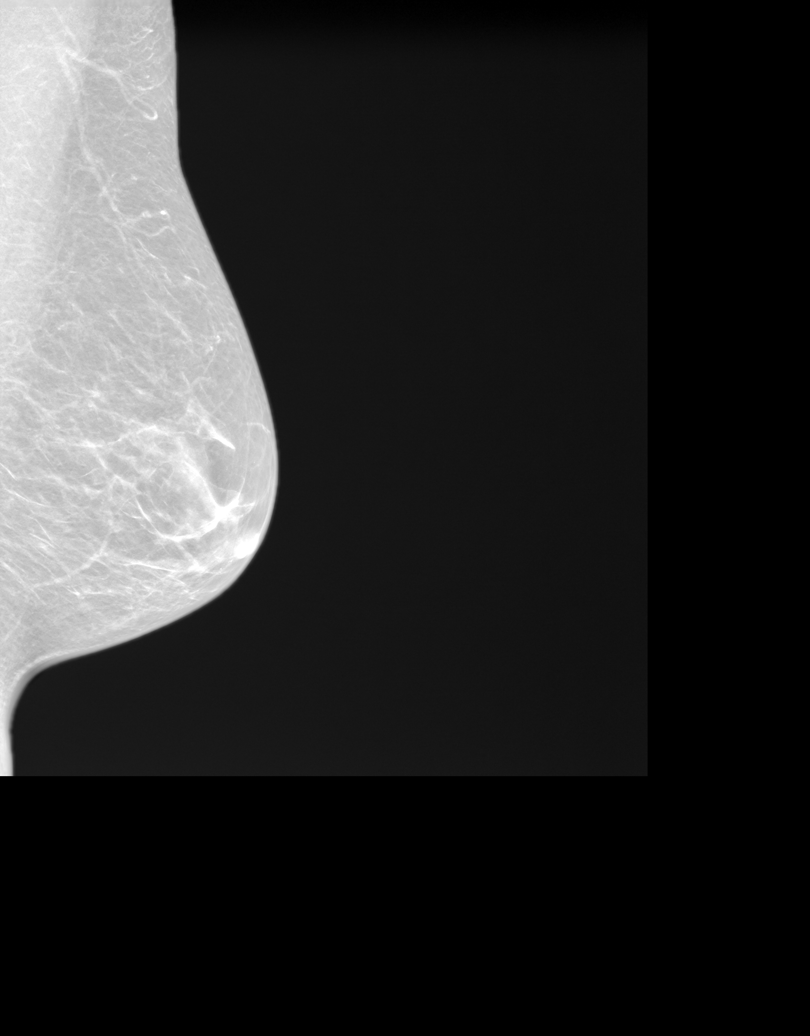

[8 of 24 positions shown; findings below may reference images not displayed]

FINDINGS: A screening mammogram is performed.  The examination shows symmetrical glandular tissue within the central portions of both breasts.  When compared with an earlier study dated 12/24/2018, no interval development of a focal mass or malignant calcifications is seen.  3D tomosynthesis shows no evidence for an occult mass or calcifications.
IMPRESSION: 1.  Normal stable screening mammogram. 

2.  ACR BREAST DENSITY:  B (Scattered areas of fibroglandular density).

3.  BIRADS 1-Negative mammogram.  Patient has been added in a reminder system with a target date for the next screening mammography.

4.  Management recommendation:  Routine mammographic screening.   

Final Assessment Code:

Bi-Rads 1 

BI-RADS 0
Need additional imaging evaluation

BI-RADS 1
Negative mammogram

BI-RADS 2
Benign finding

BI-RADS 3
Probably benign finding: short-interval follow-up suggested

BI-RADS 4
Suspicious abnormality:  biopsy should be considered

BI-RADS 5
Highly suggestive of malignancy; appropriate action should be taken

BI-RADS 6
Known Biopsy-proven Malignancy – Appropriate action should be taken

NOTE:
In compliance with Federal regulations, the results of this mammogram are being sent to the patient.

------------- REPORT [id number] -------------
[facility]
[address]
We wish to report the following on your recent mammography examination. We are sending a report to your referring physician or other health care provider. 
(       Normal/Negative:
No evidence of cancer.
This statement is mandated by the [place], Department of Health.
Your examination was performed by one of our technologists, who are registered radiological technologists and also specially certified in mammography:
___
Frank, Zarina (M)
___
Huidrom, Rutik (M)

Your mammogram was interpreted by our radiologist.

( 
Jean Rousso Aniece, M.D.

(Annual Breast Examination by a physician or other health care provider
(Annual Mammography Screening beginning at age 40
(Monthly Breast Self Examination

## 2018-05-13 HISTORY — PX: HX EYELID SURGERY: 2100001304

## 2018-10-08 IMAGING — MG 3D SCREENING MAMMO BIL W/CAD
5 series · 9 of 24 positions shown · non-contrast
Comparison: 08/02/2018 and 07/31/2017.

------------- REPORT [id number] -------------
[facility]
[address]
We wish to report the following on your recent mammography examination. We are sending a report to your referring physician or other health care provider. 
(       Normal/Negative:
No evidence of cancer.
This statement is mandated by the [place], Department of Health.
Your examination was performed by one of our technologists, who are registered radiological technologists and also specially certified in mammography:
___
Frank, Zarina (M)
Huidrom, Rutik (M)

Your mammogram was interpreted by our radiologist.
( 
Jean Rousso Aniece, M.D.
(Annual Breast Examination by a physician or other health care provider
(Annual Mammography Screening beginning at age 40
(Monthly Breast Self Examination
------------- REPORT [id number] -------------
JOREL FOY,PA
EXAM:  3D BILATERAL ANNUAL SCREENING DIGITAL MAMMOGRAM WITH TOMOSYNTHESIS AND CAD
INDICATION: Screening.

[R CC · right · 0.10mm/px · 2 of 2 slices shown]
[im 1/2]
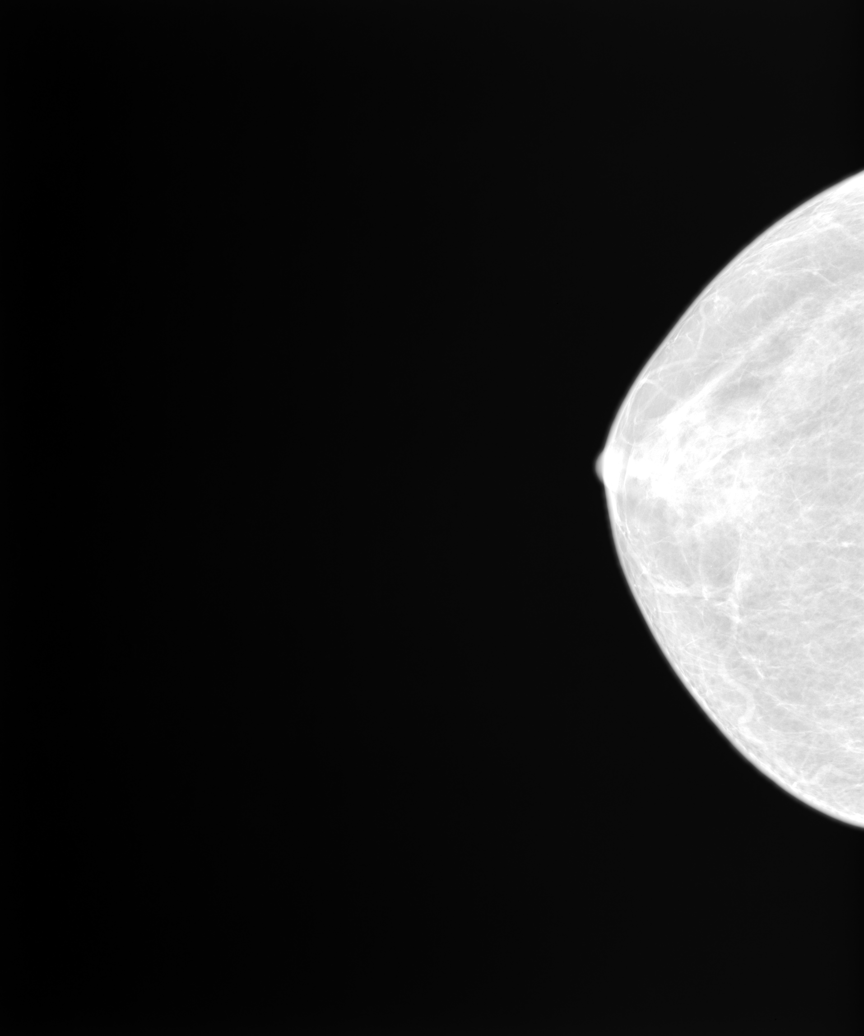
[im 2/2]
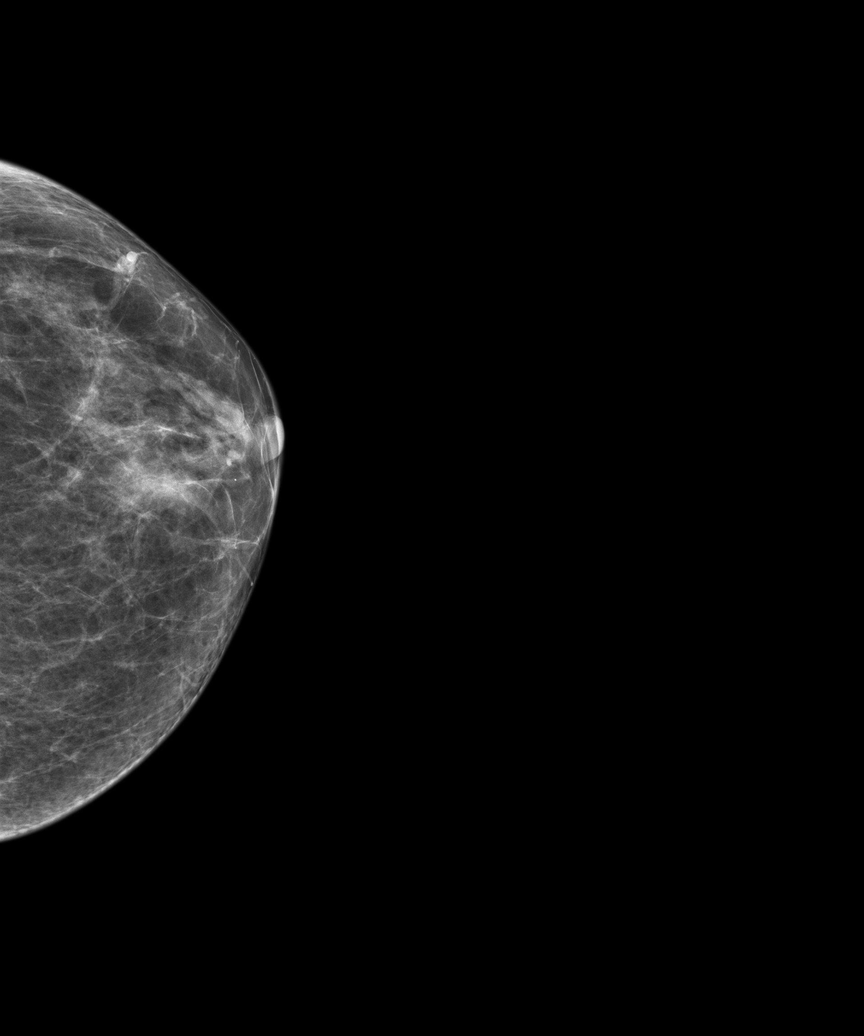

[3D SCREENING MAMMO BIL W/CAD · 2 acquisitions, 4 frames shown (1 of 2)]
[im 1/2]
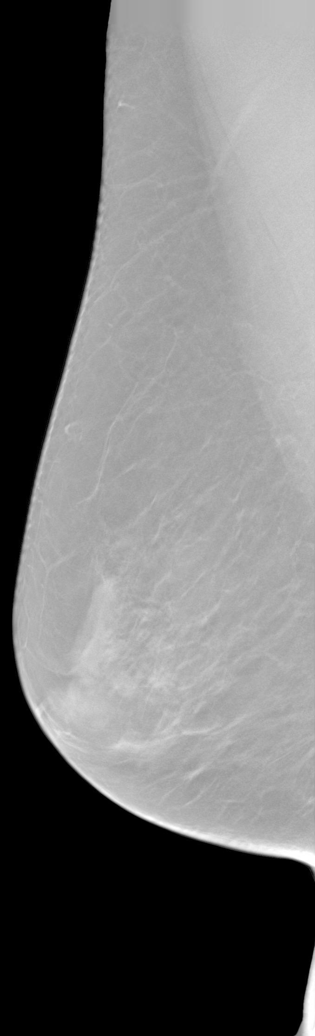
[im 1/2]
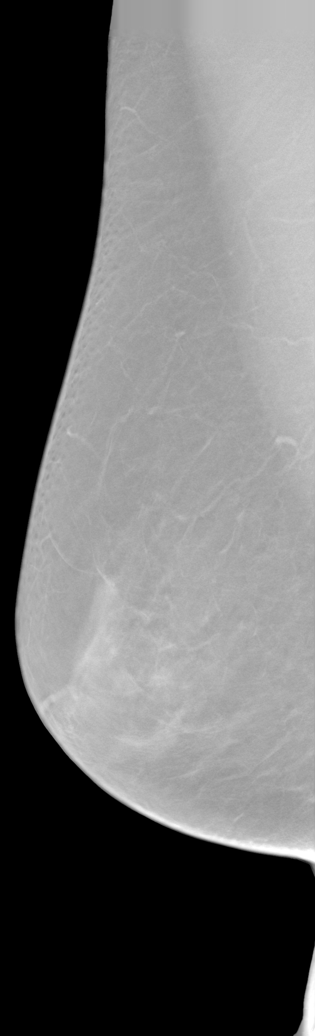
[im 2/2]
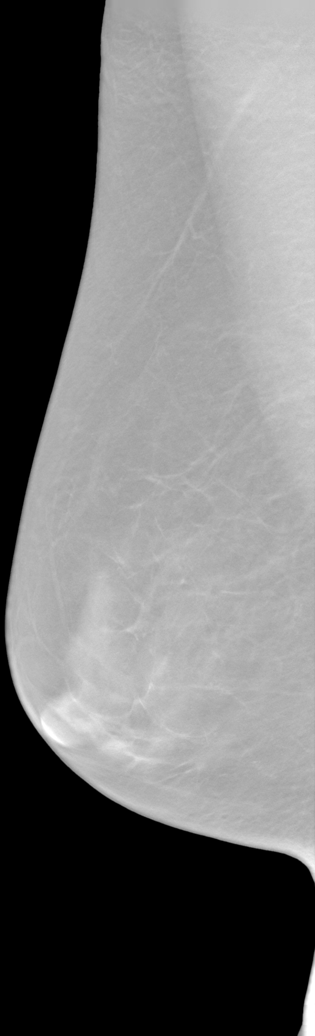
[im 2/2]
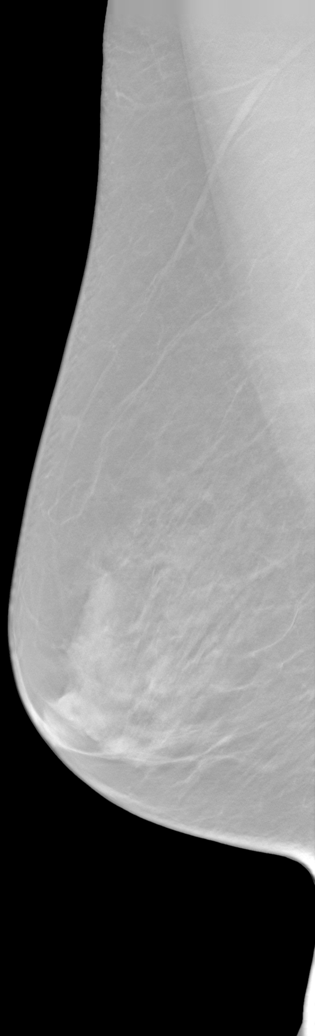

[R]
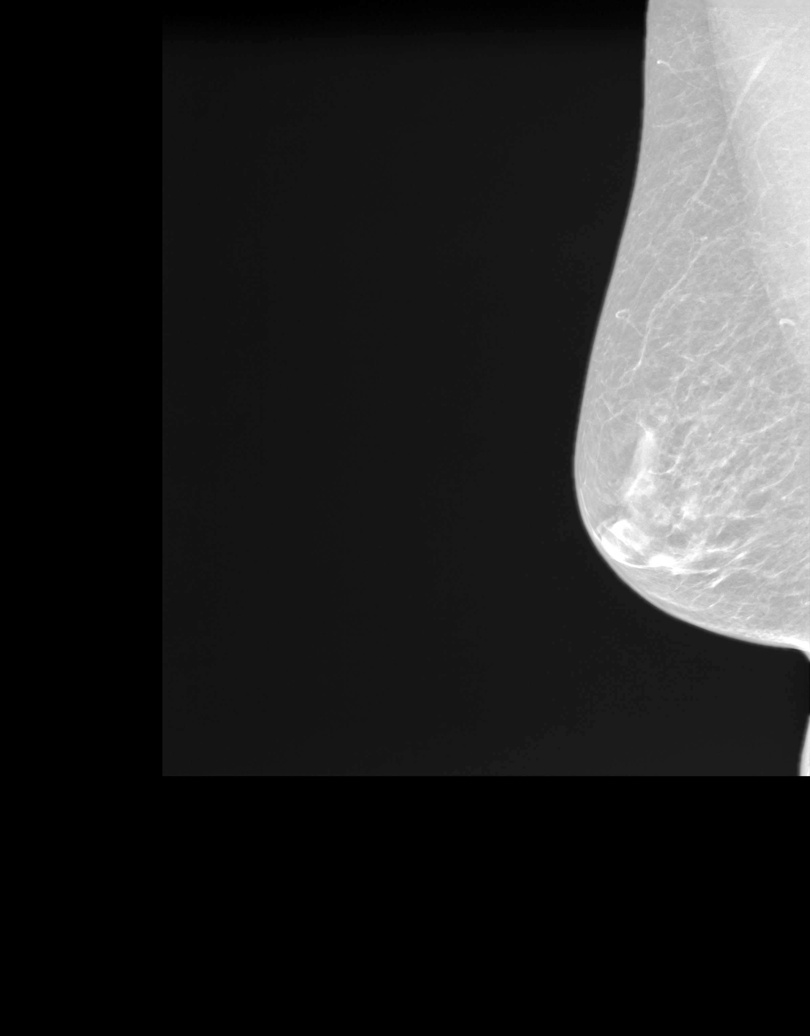

[3D SCREENING MAMMO BIL W/CAD (2 of 2) · tomo slice 6/37.0]
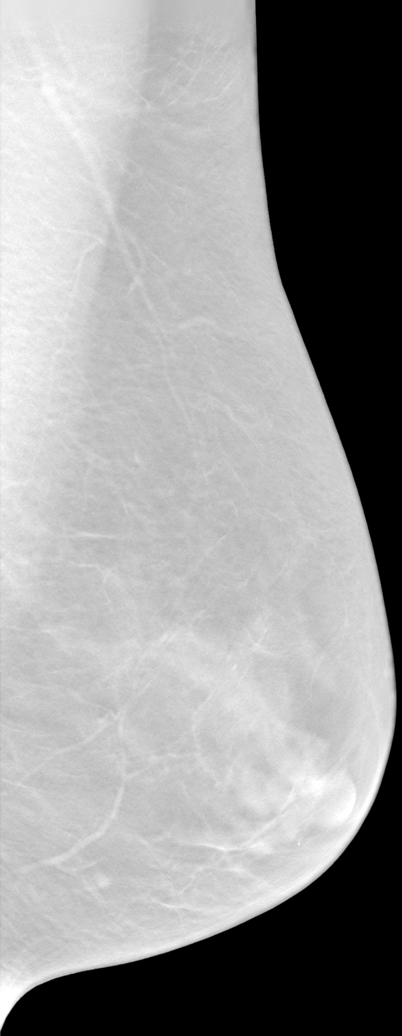

[L]
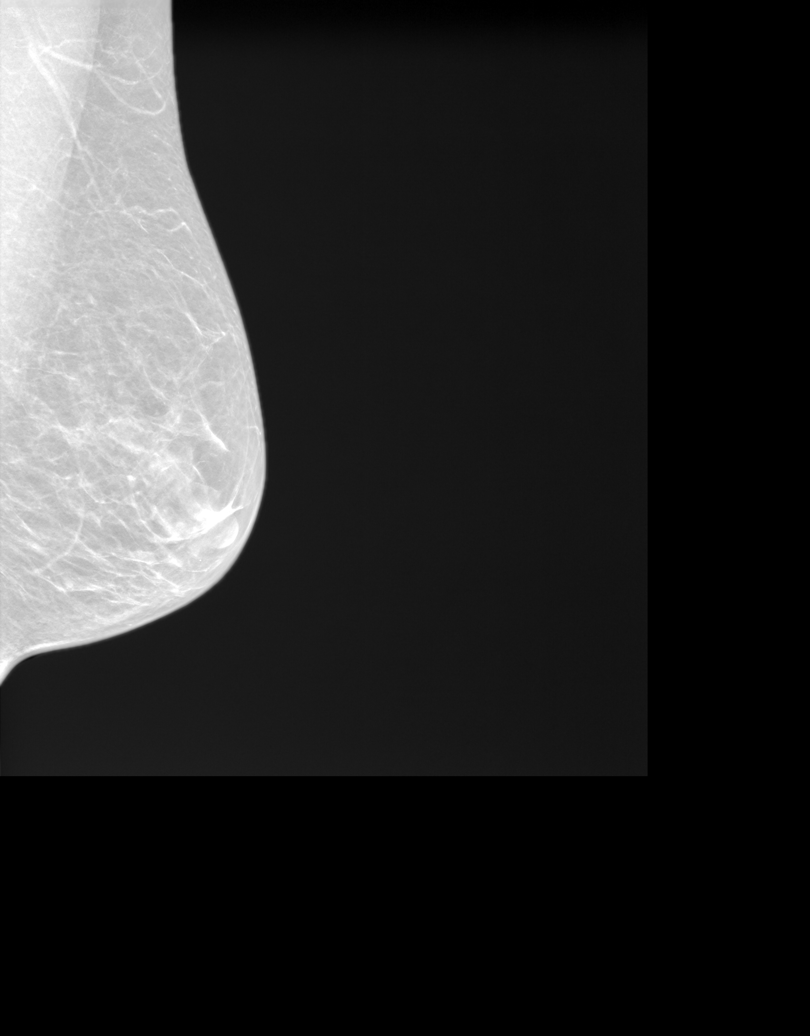

[9 of 24 positions shown; findings below may reference images not displayed]

FINDINGS: There are scattered fibroglandular elements.  There is no mass or suspicious cluster of microcalcifications.   There is no architectural distortion, skin thickening or nipple retraction.
IMPRESSION: 1.  BIRADS 2-Benign findings. Patient has been added in a reminder system with a target date for the next screening mammography.

2.  DENSITY CODE –  B (Scattered areas of fibroglandular density). 

Final Assessment Code:

Bi-Rads 2 

BI-RADS 0
Need additional imaging evaluation

BI-RADS 1
Negative mammogram

BI-RADS 2
Benign finding

BI-RADS 3
Probably benign finding; short-interval follow-up suggested

BI-RADS 4
Suspicious abnormality; biopsy should be considered

BI-RADS 5
Highly suggestive of malignancy; appropriate action should be taken

BI-RADS 6
Known biopsy-proven malignancy; appropriate action should be taken

NOTE:
In compliance with Federal regulations, the results of this mammogram are being sent to the patient.

## 2019-10-12 IMAGING — CR XRAY KNEE 3 VIEWS LT
1 series · 3 of 3 positions shown · non-contrast
Comparison: None available.

EXAM:  XRAY KNEE 3 VIEWS RT

EXAM: XRAY KNEE 3 VIEWS LT
INDICATION: Bilateral knee pain.

[Series 1: view not recorded · 0.17mm/px · 3 of 3 slices shown]
[im 1/3]
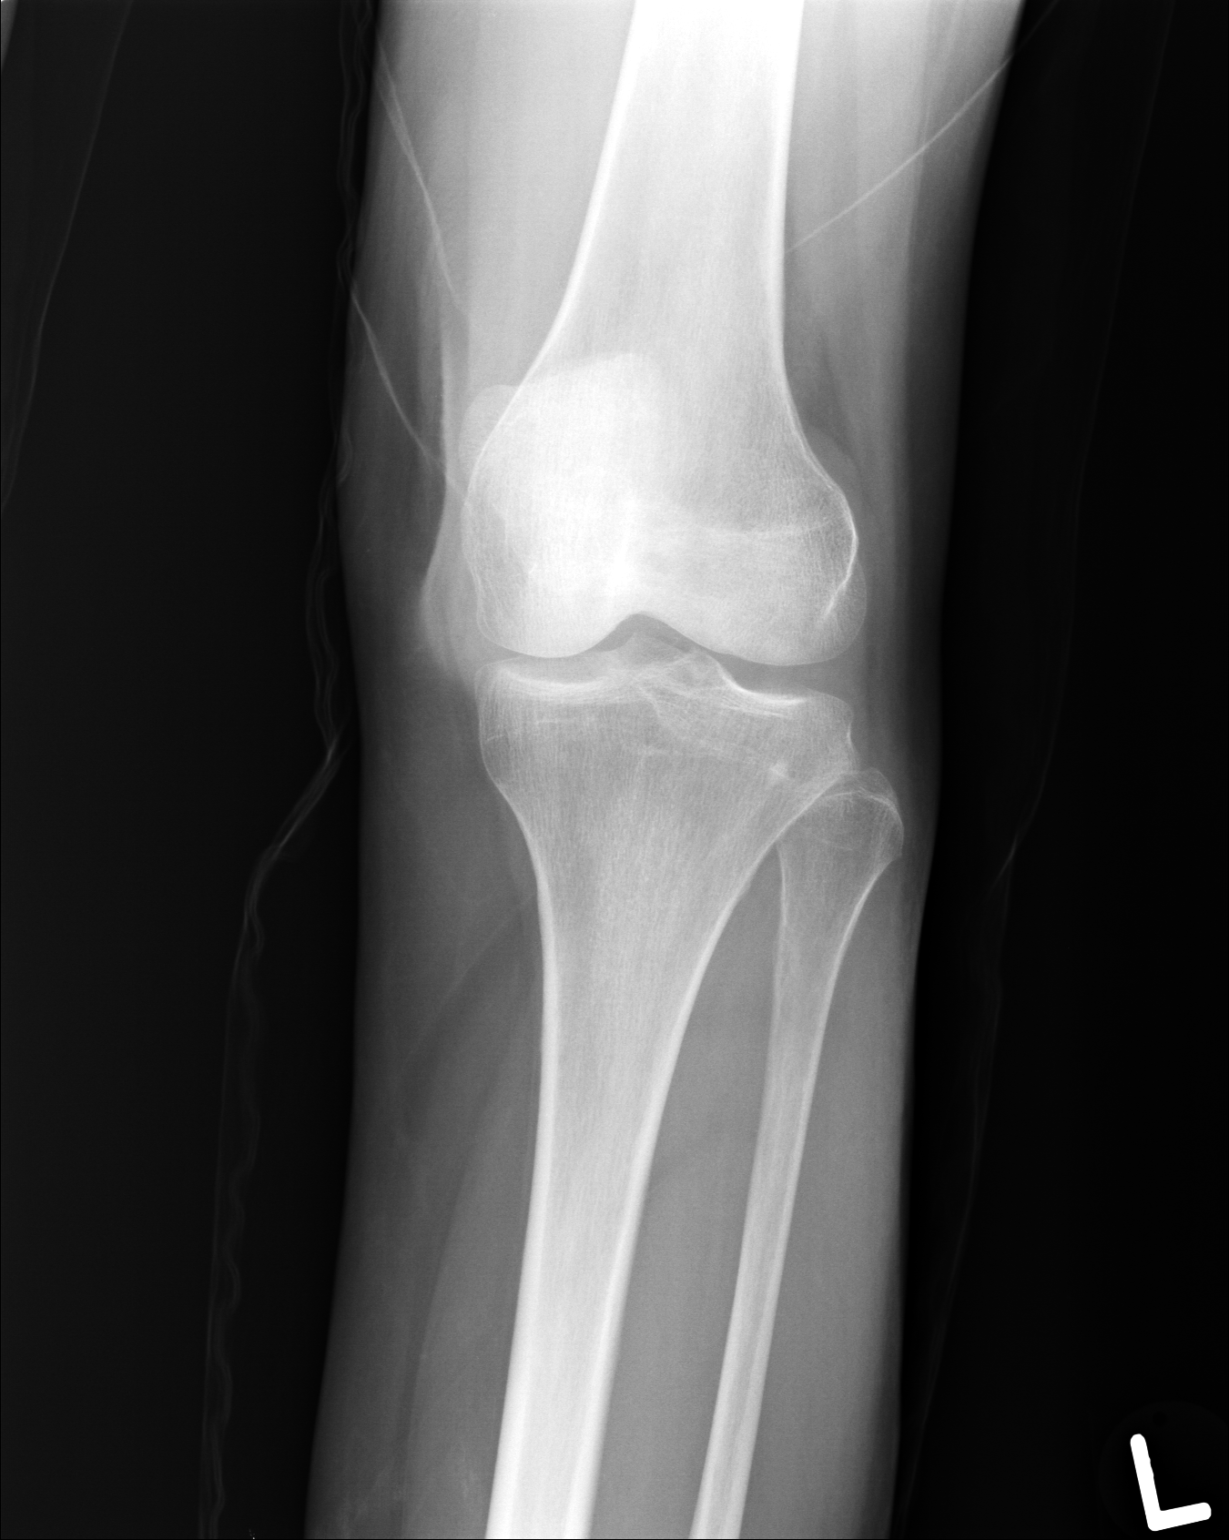
[im 2/3]
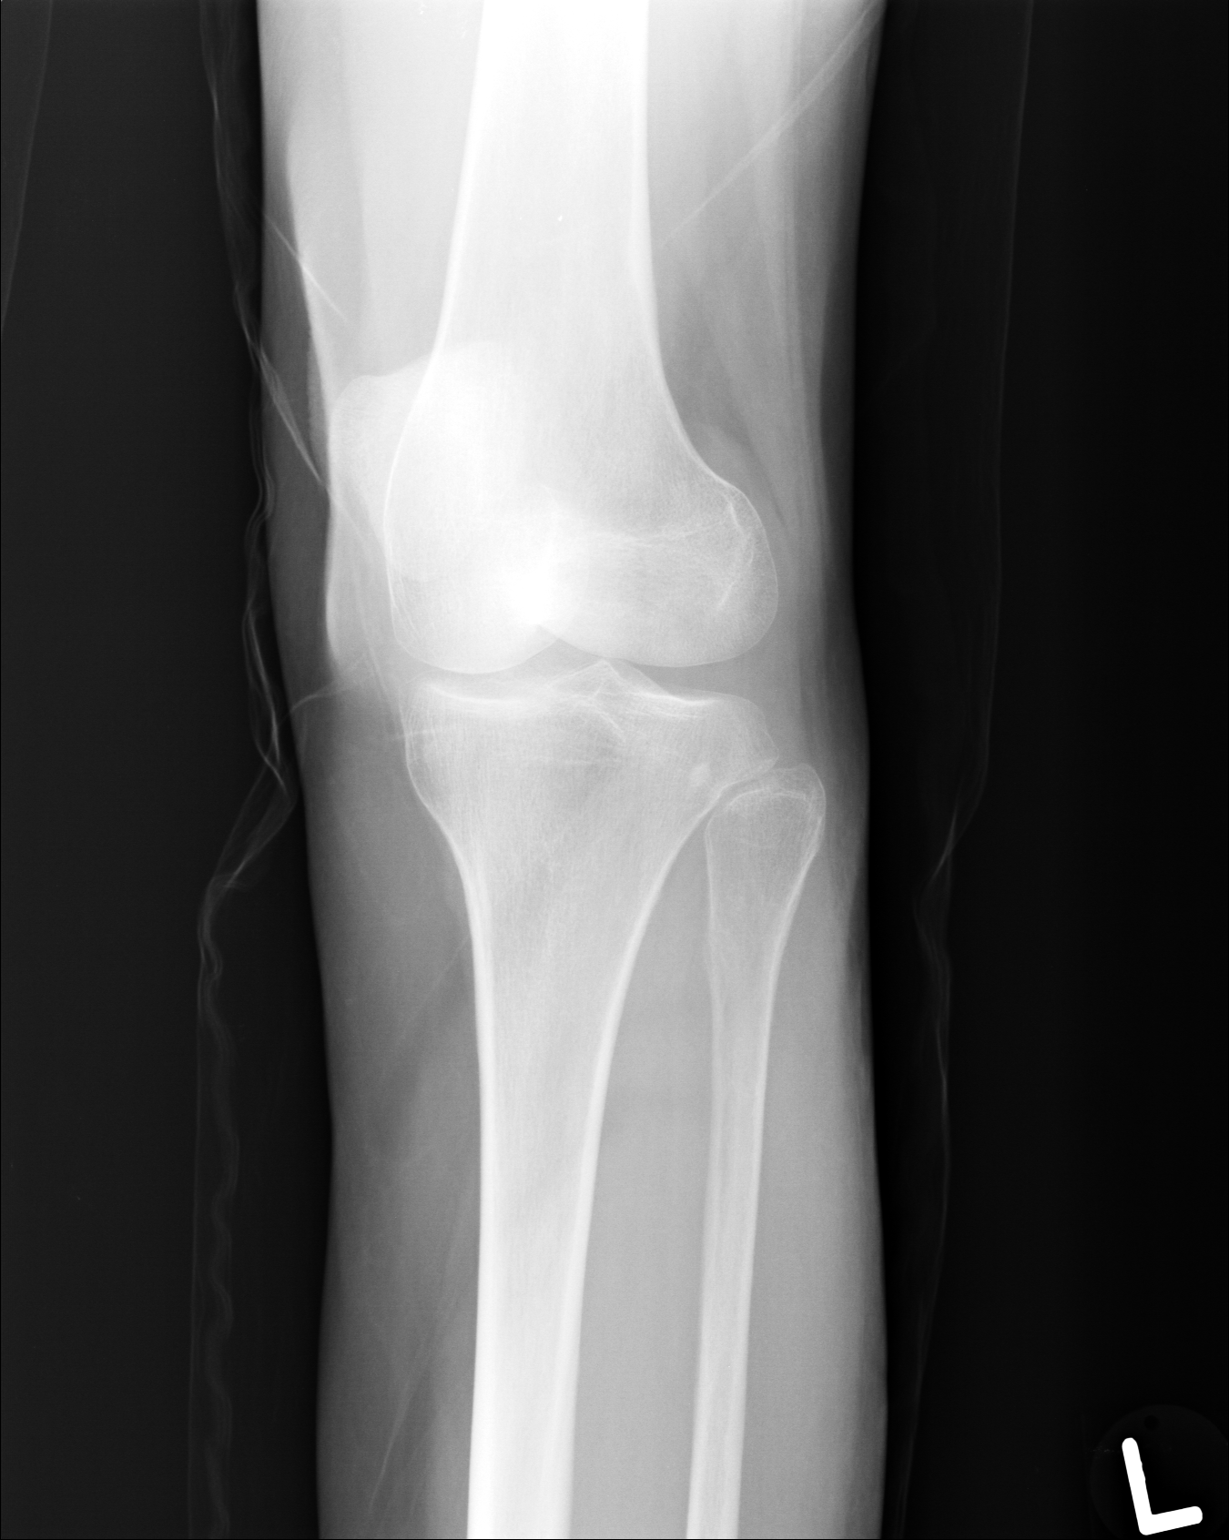
[im 3/3]
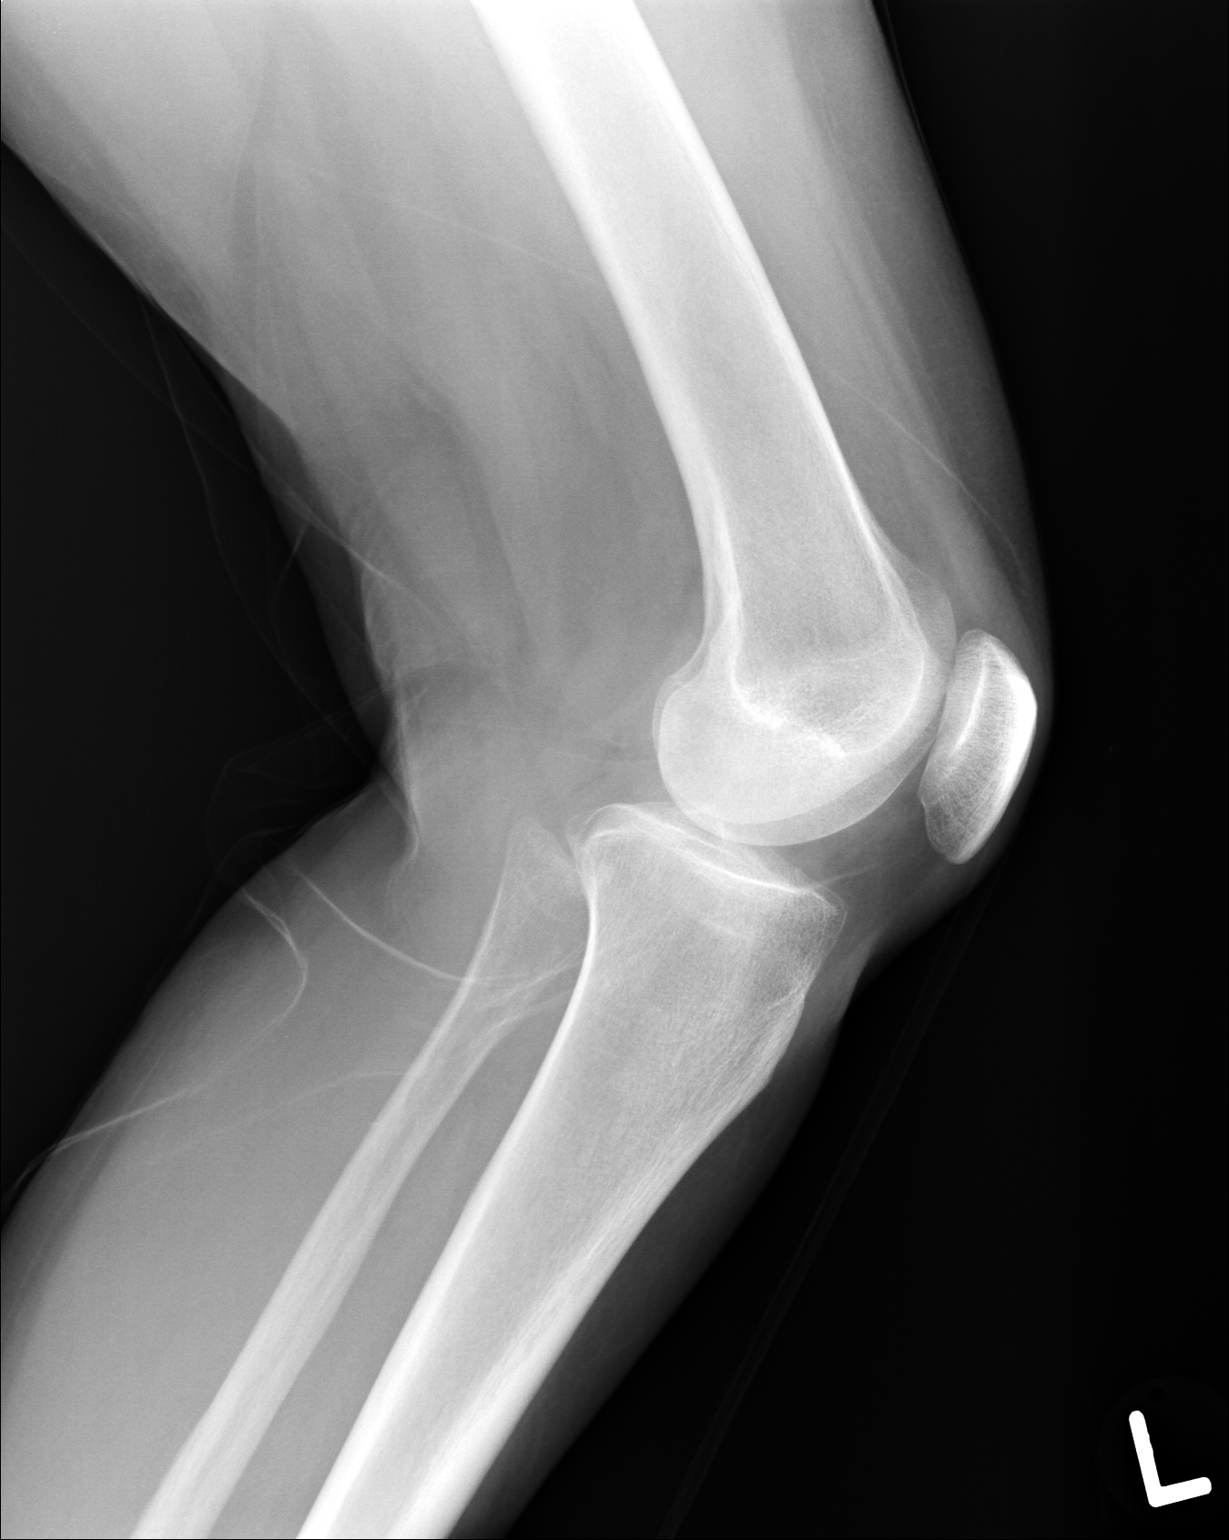

[3 of 3 positions shown; findings below may reference images not displayed]

FINDINGS: There is no acute fracture or subluxation. No significant arthritic changes are seen. There is no suprapatellar effusion on either side. There is no soft tissue abnormality.
IMPRESSION: Unremarkable exams.

## 2019-10-12 IMAGING — MG 3D SCREENING MAMMO BIL W/CAD
5 series · 9 of 24 positions shown · non-contrast
Comparison: 09/09/2019 and 09/04/2018.

------------- REPORT [id number] -------------
[facility]
[address]
We wish to report the following on your recent mammography examination. We are sending a report to your referring physician or other health care provider. 
(       Normal/Negative:
No evidence of cancer.
This statement is mandated by the [place], Department of Health.
Your examination was performed by one of our technologists, who are registered radiological technologists and also specially certified in mammography:
___
Frank, Zarina (M)
Sell, Pepper (M)

Your mammogram was interpreted by our radiologist.
( 
Jean Rousso Aniece, M.D.
(Annual Breast Examination by a physician or other health care provider
(Annual Mammography Screening beginning at age 40
(Monthly Breast Self Examination
------------- REPORT [id number] -------------
ADEPRASSTYO ALLSTARR,PA
EXAM:  3D BILATERAL ANNUAL SCREENING DIGITAL MAMMOGRAM WITH CAD AND TOMOSYNTHESIS
INDICATION: Screening.

[Series 5007: R CC · right · 2 of 3 slices shown]
[im 1/3]
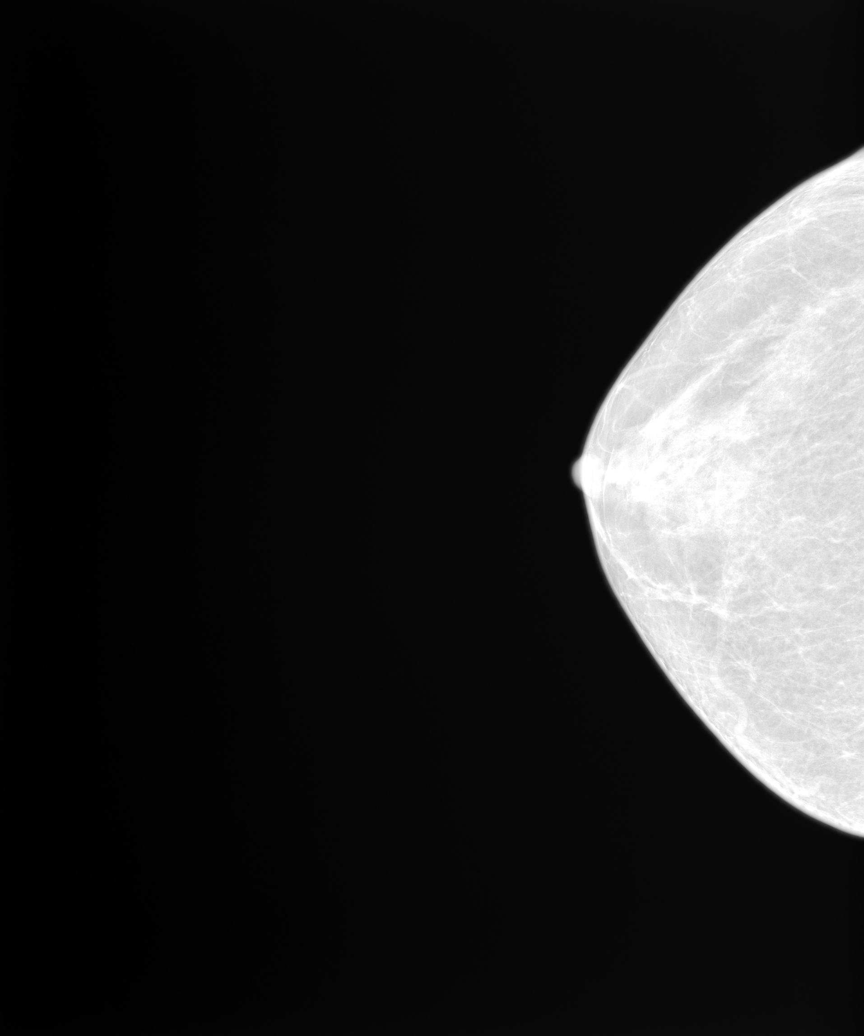
[im 3/3]
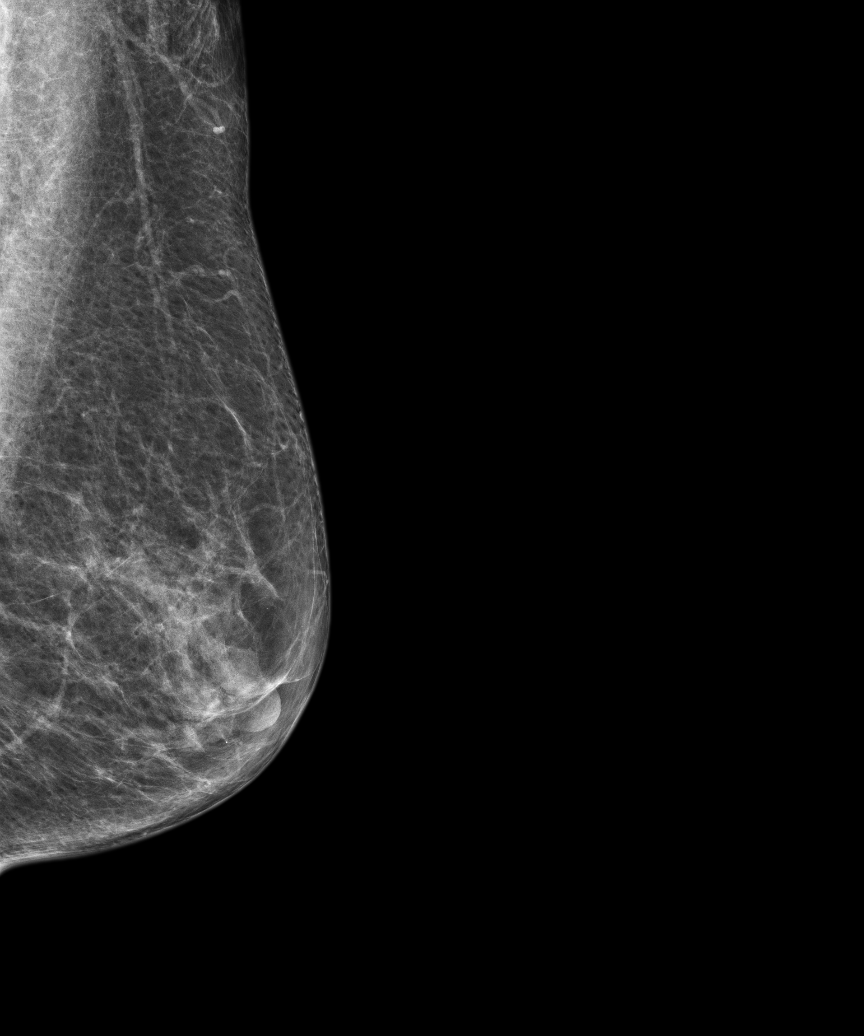

[Series 5009: 3D SCREENING MAMMO BIL W/CAD · 2 acquisitions, 4 frames shown (1 of 2)]
[im 1/2]
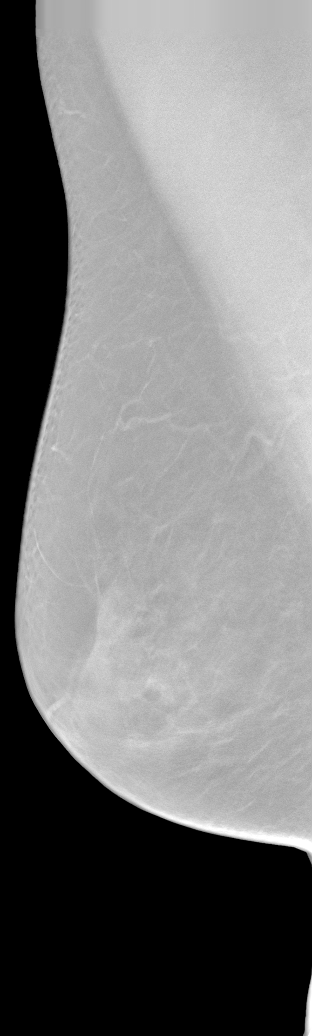
[im 1/2]
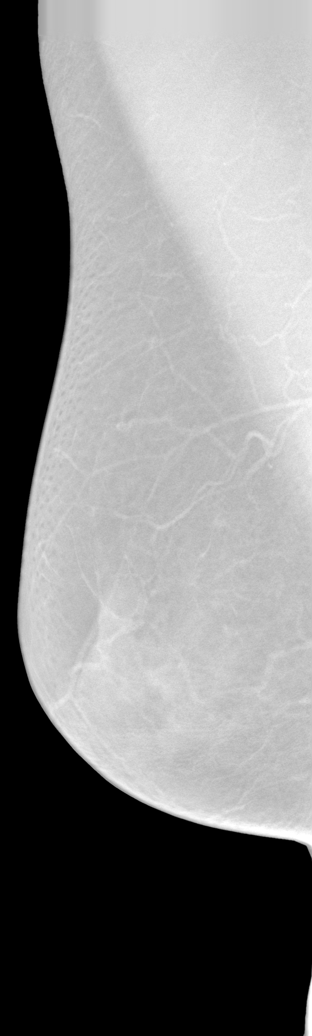
[im 2/2]
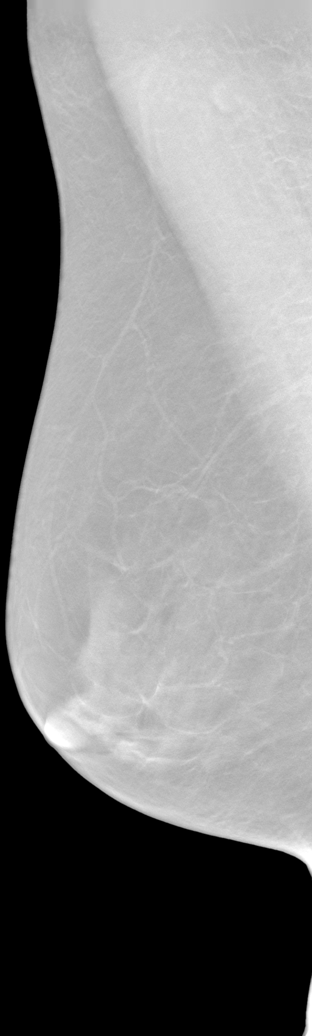
[im 2/2]
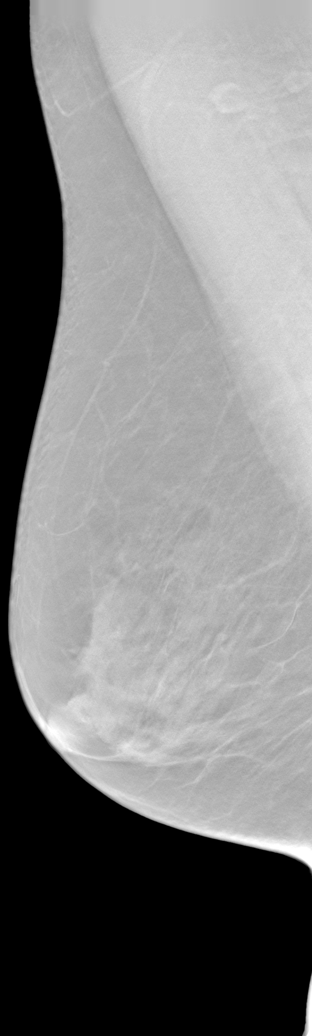

[3D SCREENING MAMMO BIL W/CAD (2 of 2) · tomo slice 7/39.0]
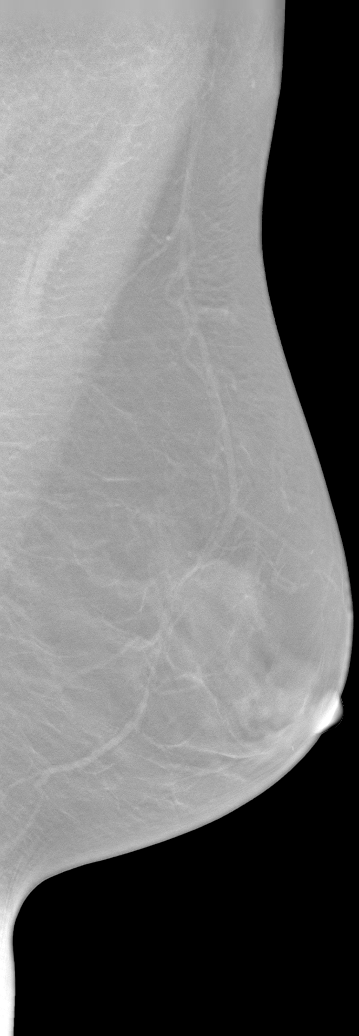

[R]
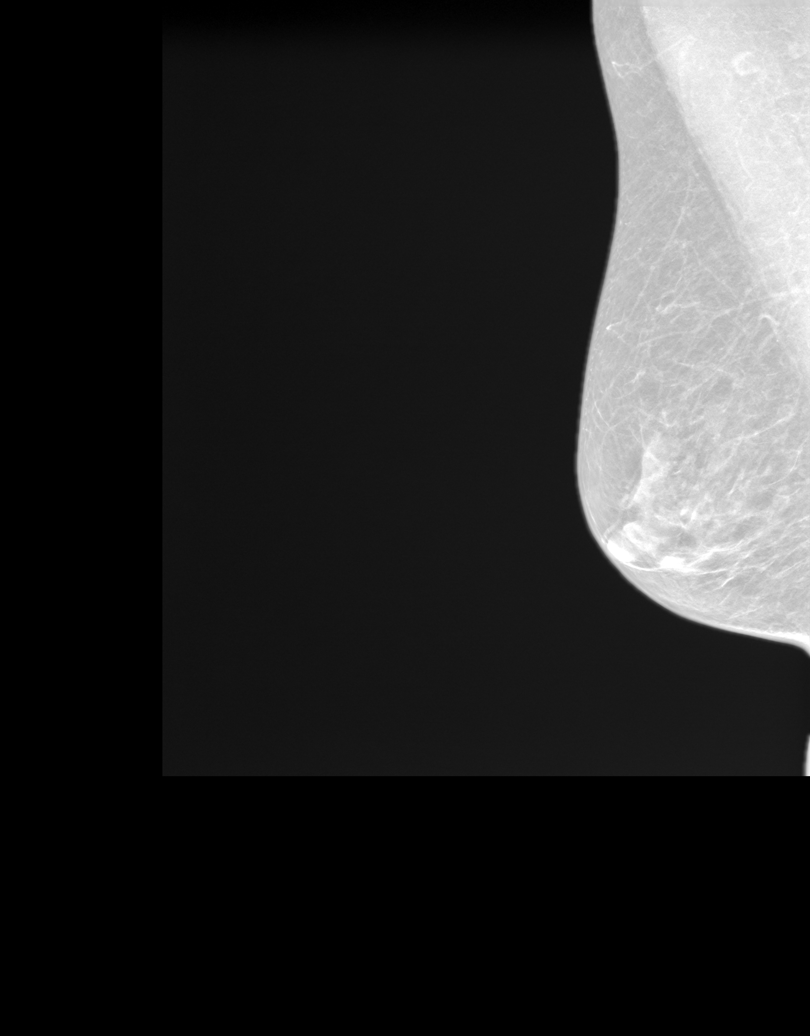

[L]
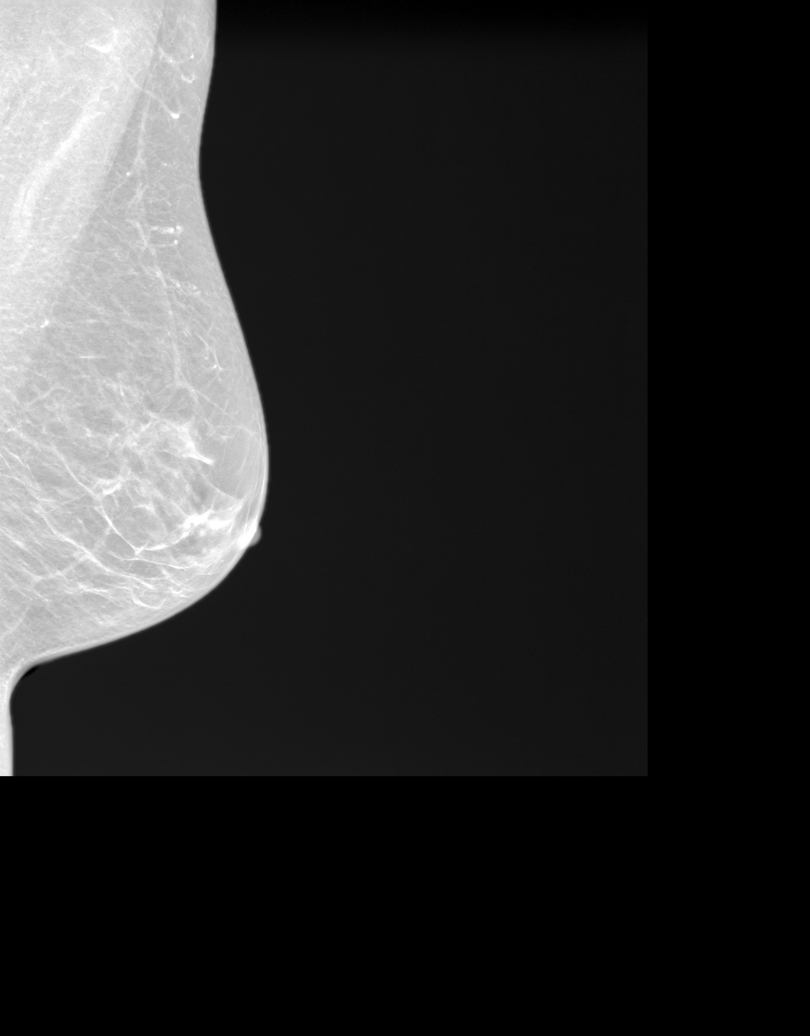

[9 of 24 positions shown; findings below may reference images not displayed]

FINDINGS: There are scattered fibroglandular elements.  There is no mass or suspicious cluster of microcalcifications.   There is no architectural distortion, skin thickening or nipple retraction.
IMPRESSION: 1.  BIRADS 2-Benign findings. Patient has been added in a reminder system with a target date for the next screening mammography.

2.  DENSITY CODE – B (Scattered areas of fibroglandular density). 

Final Assessment Code:

Bi-Rads 2 

BI-RADS 0
Need additional imaging evaluation.

BI-RADS 1
Negative mammogram.

BI-RADS 2
Benign finding.

BI-RADS 3
Probably benign finding; short-interval follow-up suggested.

BI-RADS 4
Suspicious abnormality; biopsy should be considered.

BI-RADS 5
Highly suggestive of malignancy; appropriate action should be taken.

BI-RADS 6
Known biopsy-proven malignancy; appropriate action should be taken.

NOTE:
In compliance with Federal regulations, the results of this mammogram are being sent to the patient.

## 2019-10-12 IMAGING — CR XRAY KNEE 3 VIEWS RT
1 series · 3 of 3 positions shown · non-contrast
Comparison: None available.

EXAM:  XRAY KNEE 3 VIEWS RT

EXAM: XRAY KNEE 3 VIEWS LT
INDICATION: Bilateral knee pain.

[Series 1: view not recorded · 0.17mm/px · 3 of 3 slices shown]
[im 1/3]
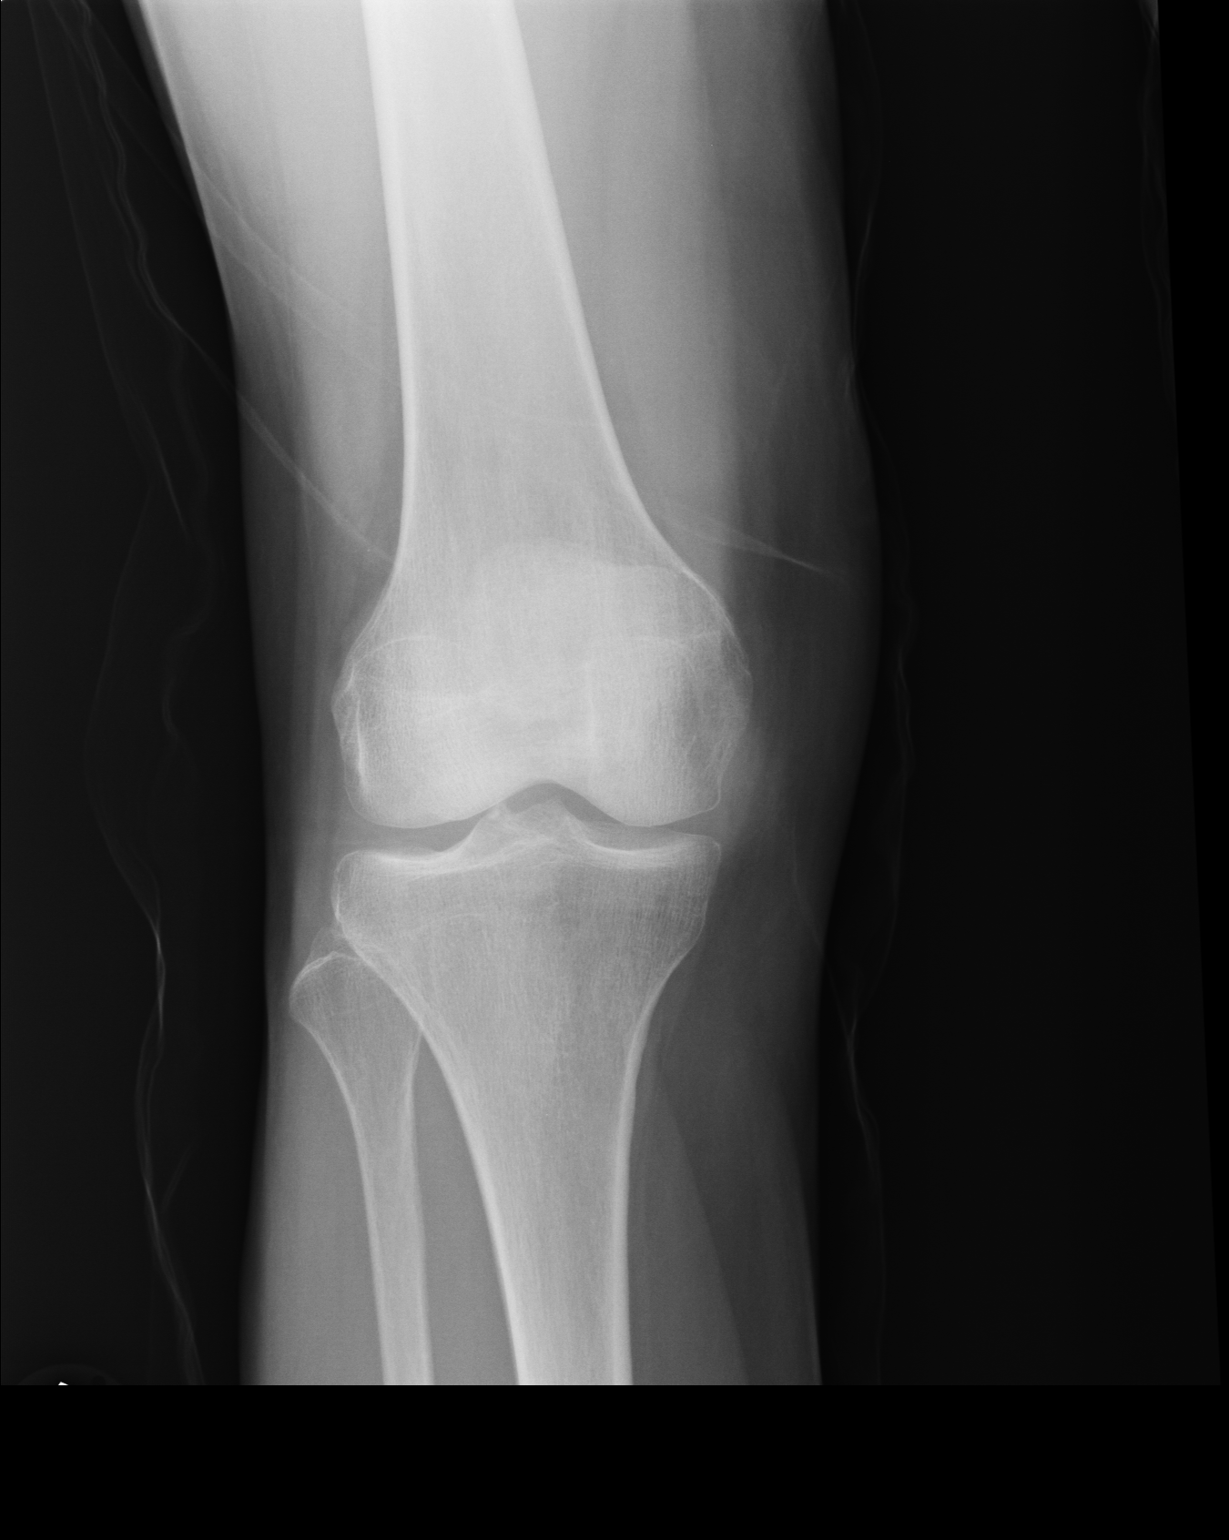
[im 2/3]
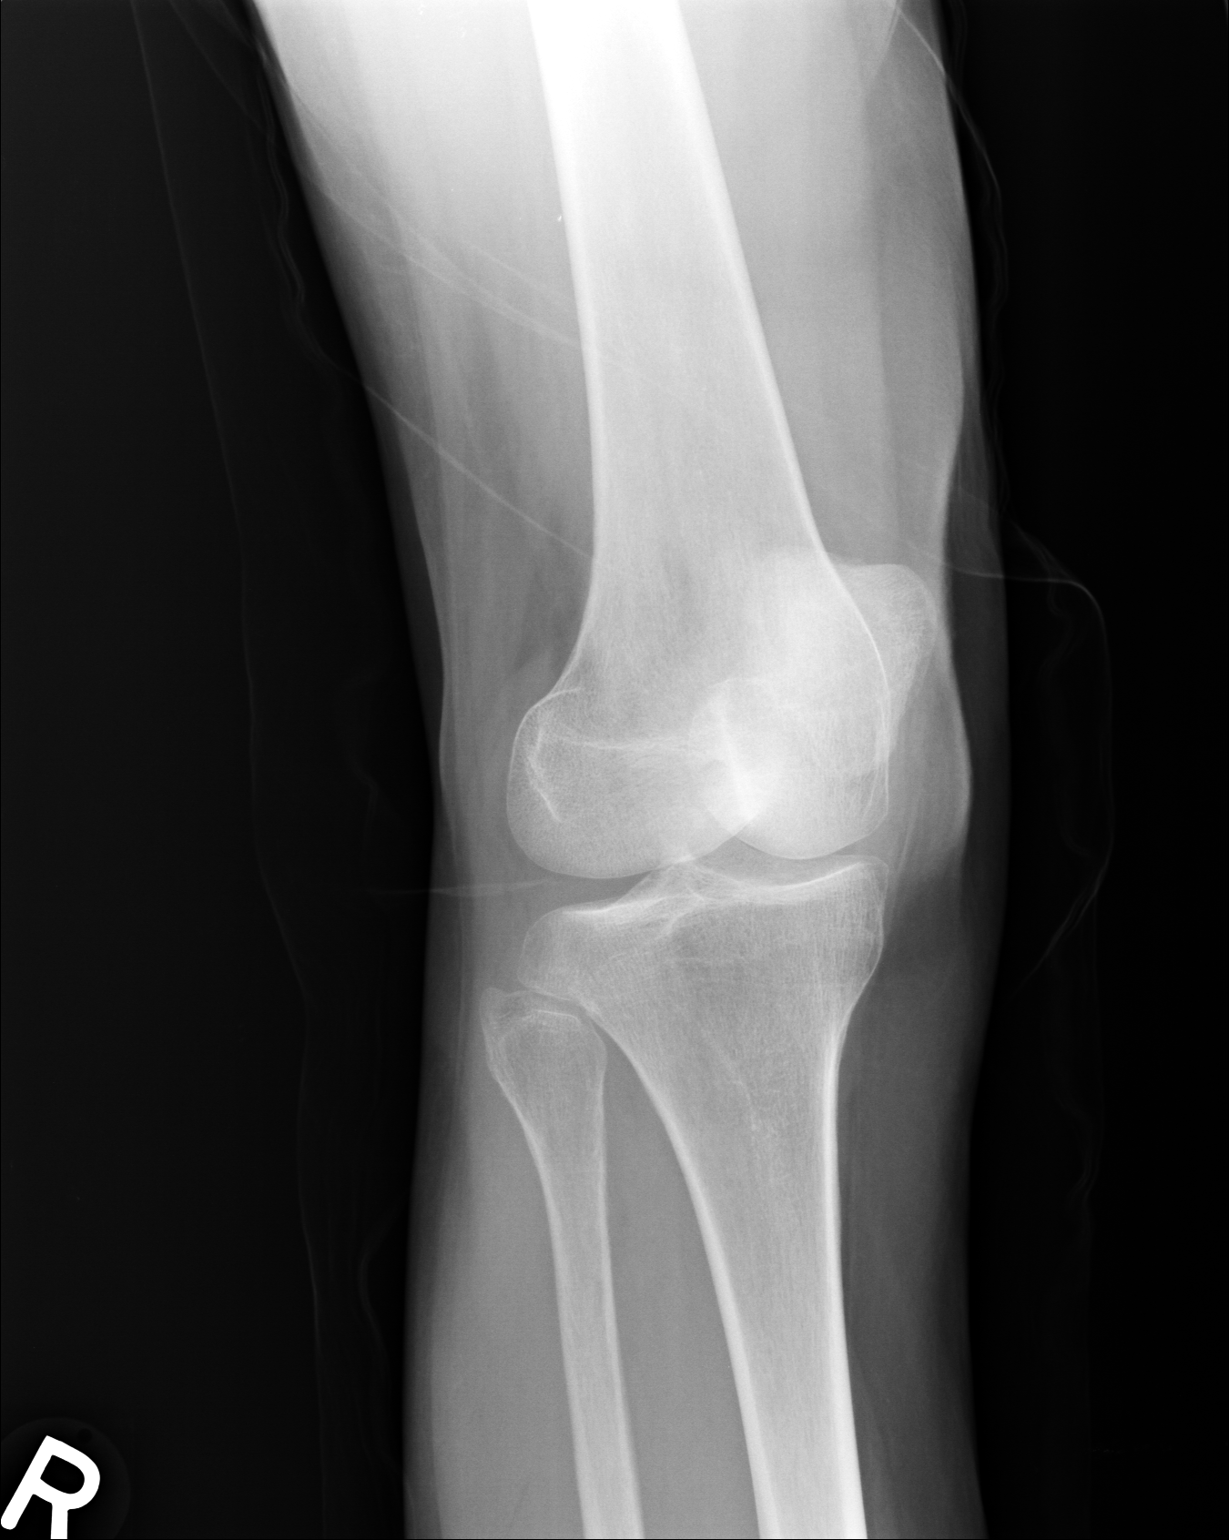
[im 3/3]
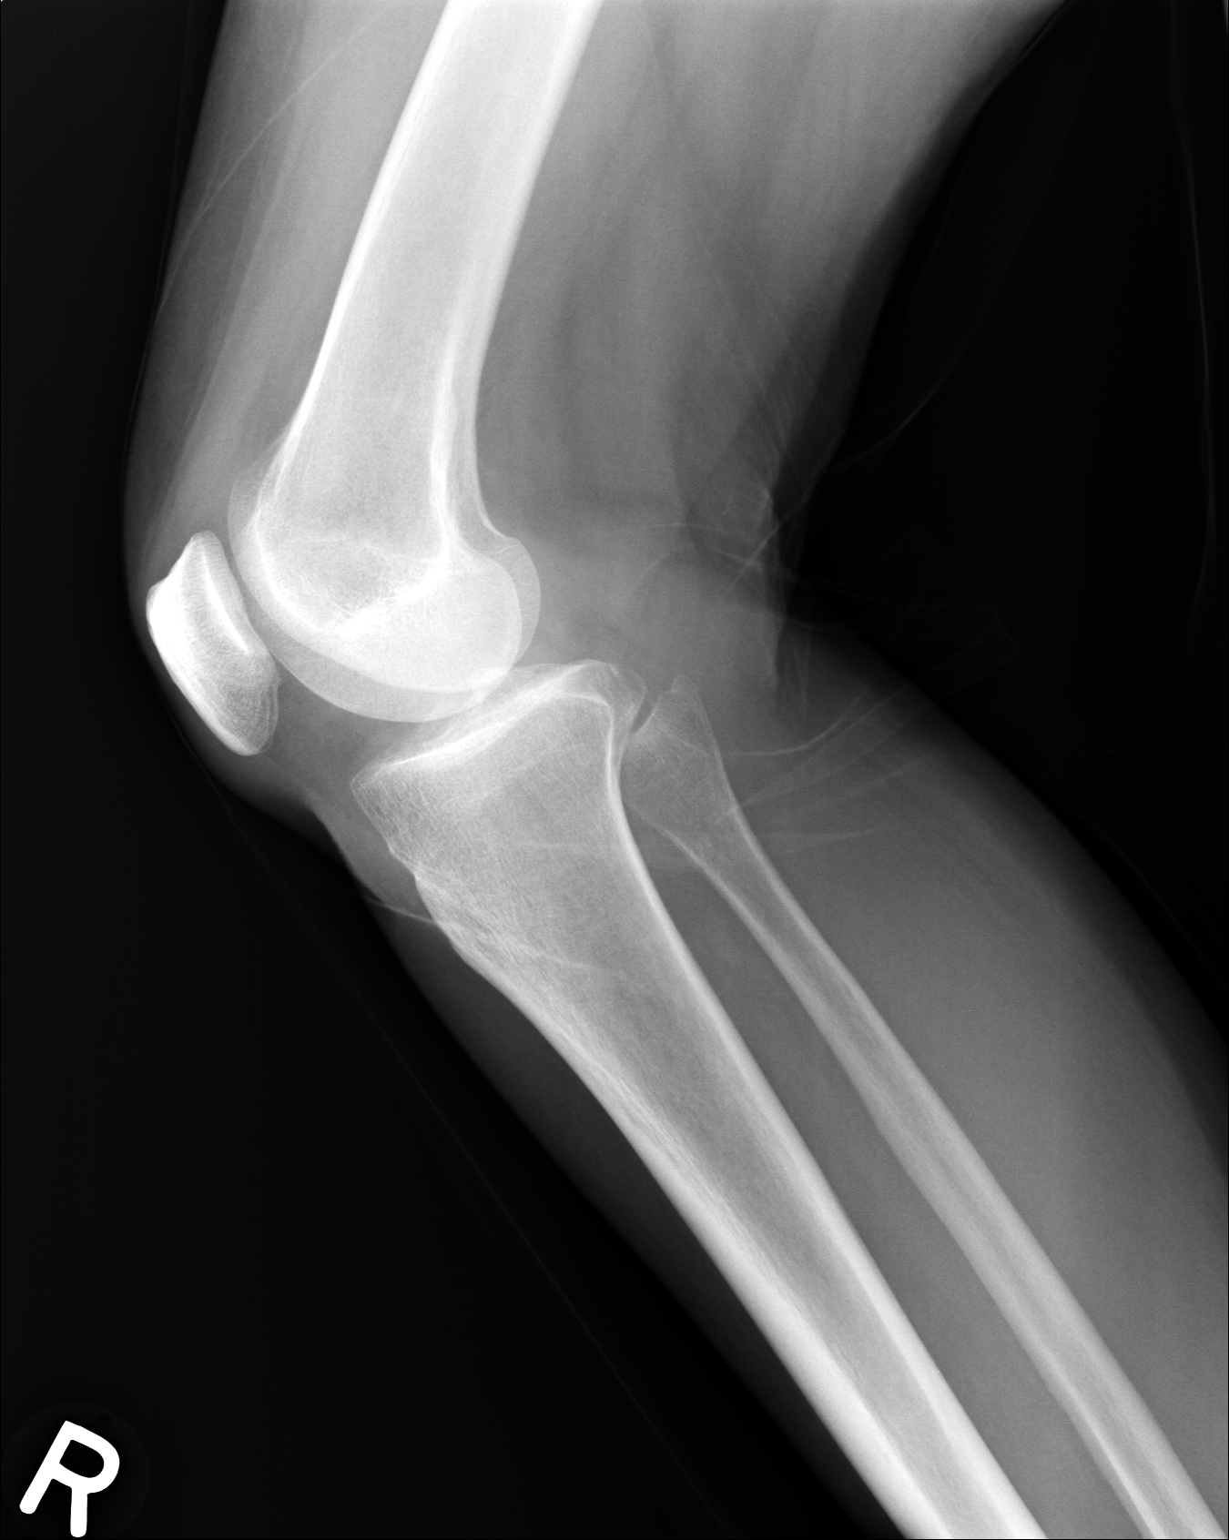

[3 of 3 positions shown; findings below may reference images not displayed]

FINDINGS: There is no acute fracture or subluxation. No significant arthritic changes are seen. There is no suprapatellar effusion on either side. There is no soft tissue abnormality.
IMPRESSION: Unremarkable exams.

## 2020-03-31 IMAGING — CR XRAY LUMBAR SPINE COMPLETE
1 series · 5 of 5 positions shown · non-contrast
Comparison: MRI of lumbar spine obtained on same date.

﻿EXAM:  [id number] - X-RAY EXAM L-S SPINE MINIMUM 4 VIEWS
INDICATION: Low back pain.

[Series 1: view not recorded · 0.17mm/px · 5 of 5 slices shown]
[im 1/5]
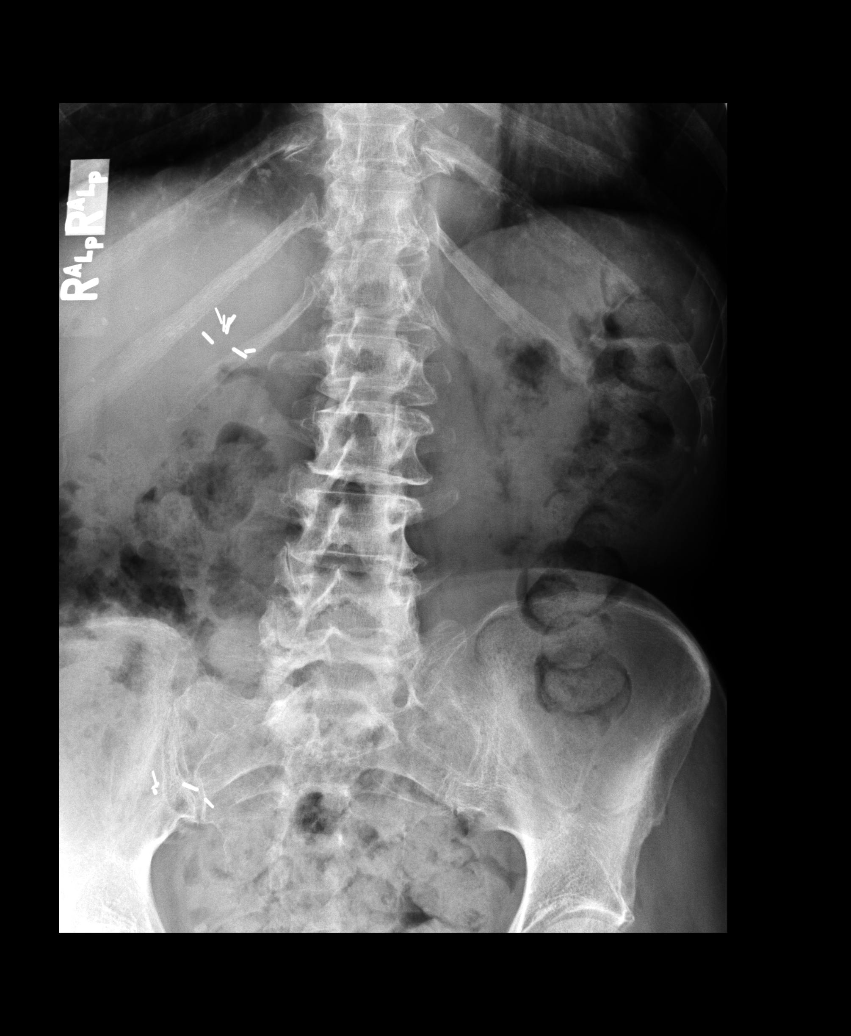
[im 2/5]
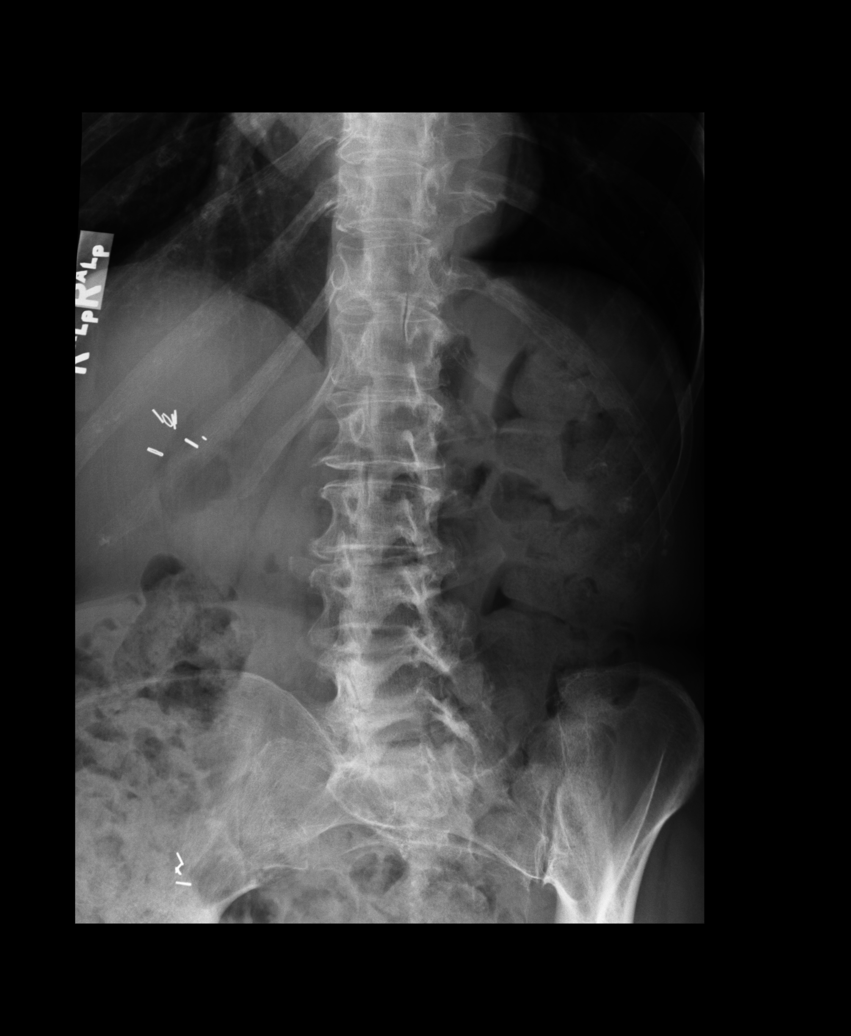
[im 3/5]
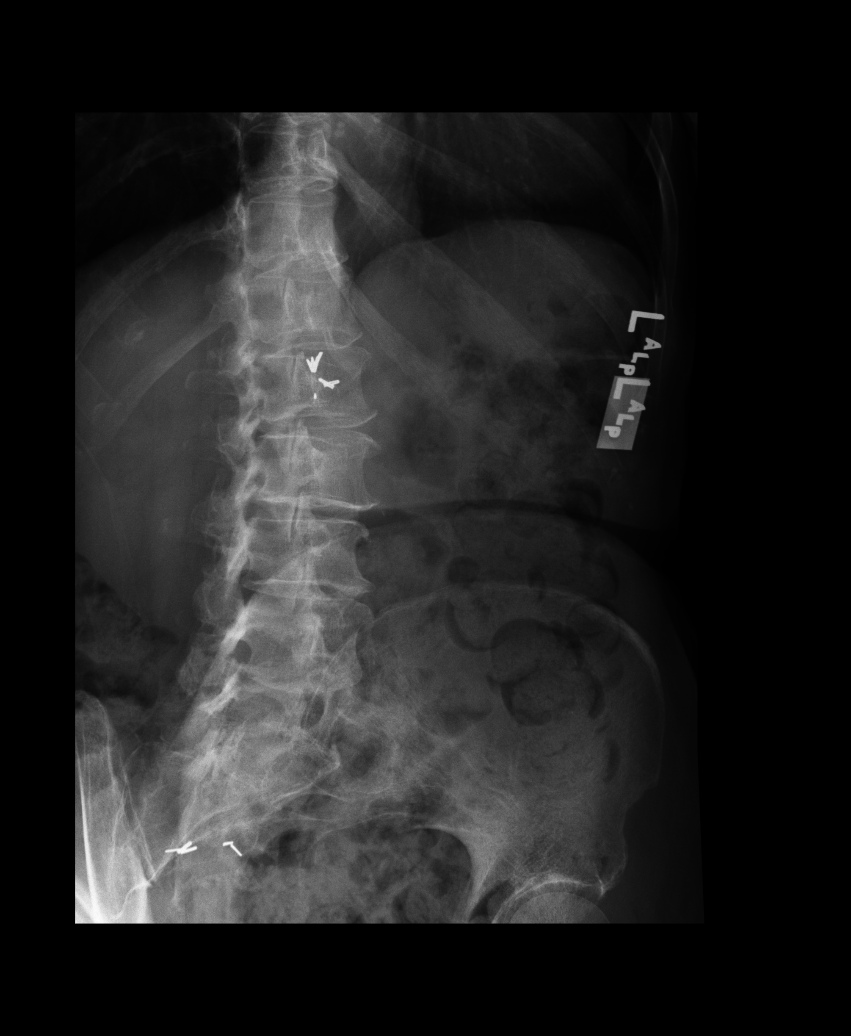
[im 4/5]
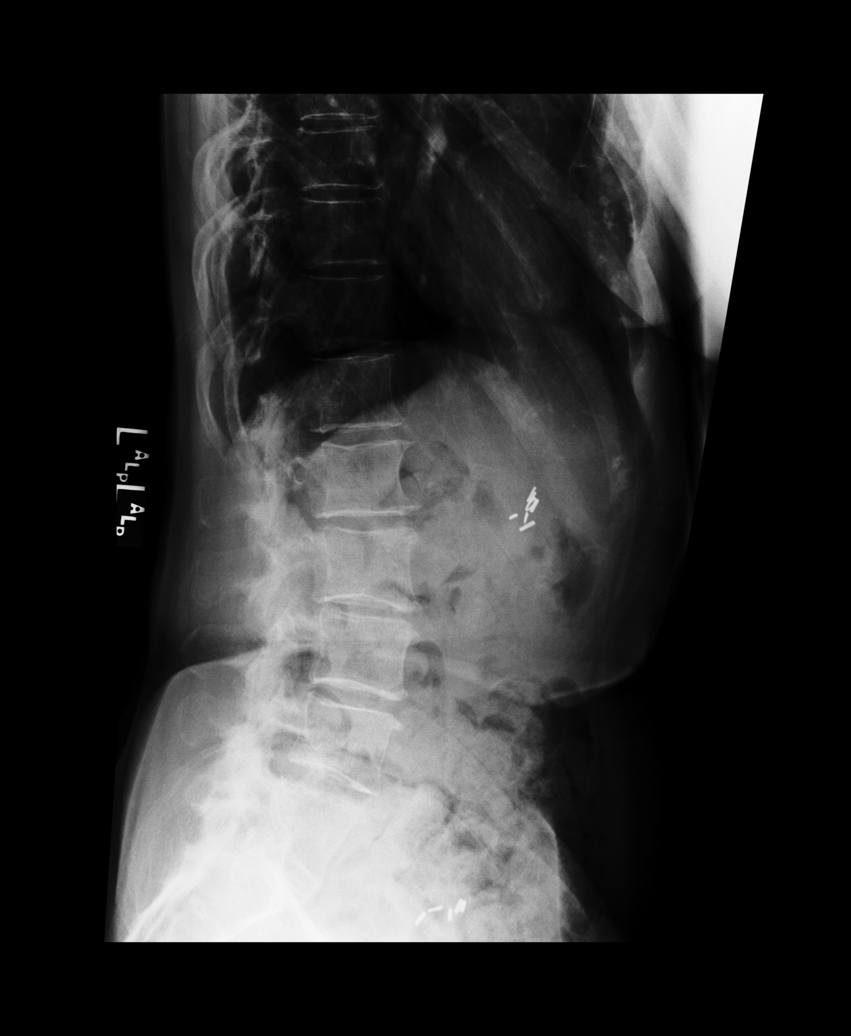
[im 5/5]
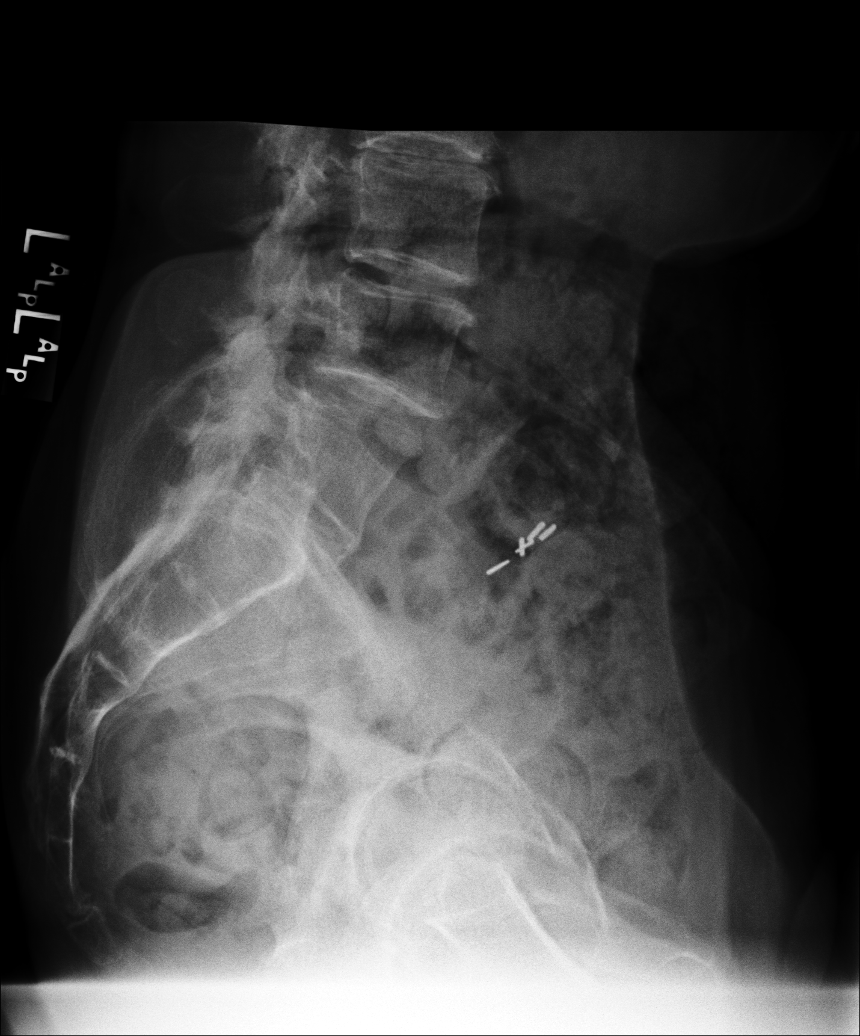

[5 of 5 positions shown; findings below may reference images not displayed]

FINDINGS: Transitional lumbosacral junction is noted consistent with MRI findings.  No acute bony lesions are seen.  Significant degenerative disc changes in the mid and lower lumbar spine consistent with MRI findings.
IMPRESSION: Degenerative disc changes and facet arthropathy in the mid and lower lumbar spine.  Transitional lumbosacral junction.  Please refer to MRI report for further findings on MRI of lumbar spine. 

Electronically Signed by VEZARO, VALDERIR at [id number] [DATE]

## 2020-03-31 IMAGING — MR MRI LUMBAR SPINE WITHOUT CONTRAST
6 series · 41 of 48 positions shown · non-contrast
Comparison: Lumbar spine x-rays dated 03/31/2020.

﻿EXAM:  MRI LUMBAR SPINE WITHOUT CONTRAST
INDICATION: 69-year-old with persistent low back pain and bilateral lower extremity weakness.  New onset of symptoms.  No prior malignancy, back surgery or recent trauma.
TECHNIQUE: Axial, coronal and sagittal images including T1, T2 and STIR sequences.

[Series 8: T2 · sagittal · 4.0mm · 0.94mm/px · 5 of 13 slices shown (1 of 3)]
[im 1/13]
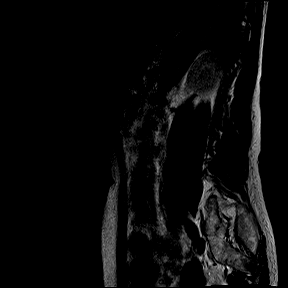
[im 4/13]
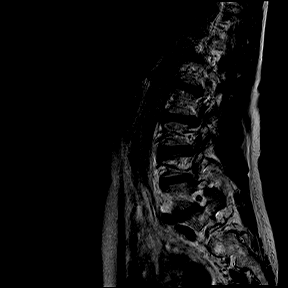
[im 7/13]
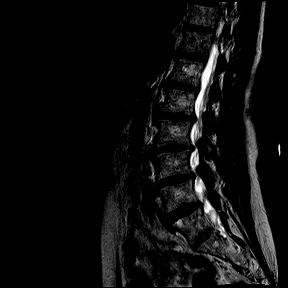
[im 10/13]
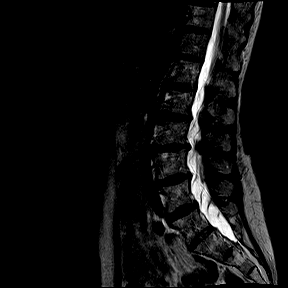
[im 13/13]
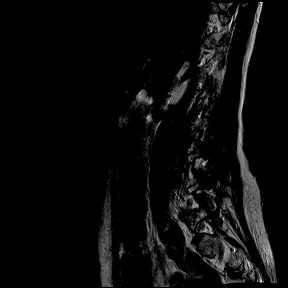

[Series 9: T1 · sagittal · 4.0mm · 0.94mm/px · 6 of 13 slices shown (1 of 2)]
[im 1/13]
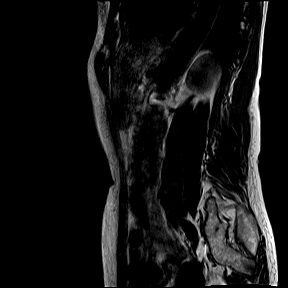
[im 3/13]
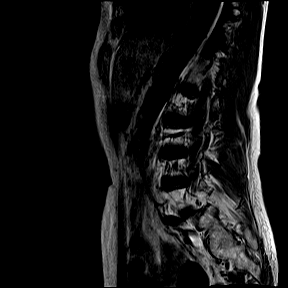
[im 5/13]
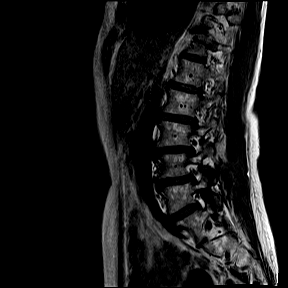
[im 8/13]
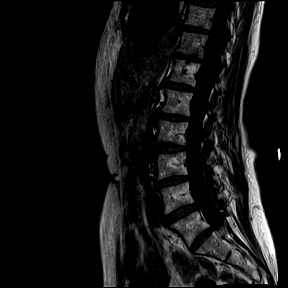
[im 10/13]
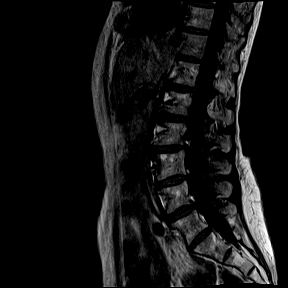
[im 13/13]
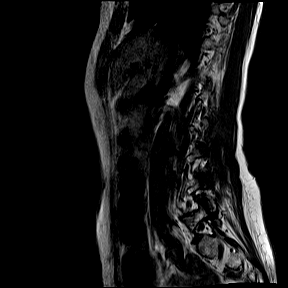

[Series 11: STIR · sagittal · 4.0mm · 1.05mm/px · 2 of 13 slices shown]
[im 1/13]
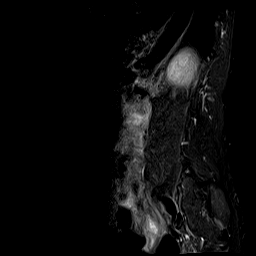
[im 3/13]
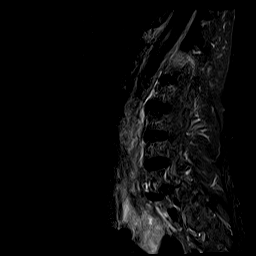

[Series 12: T2 · axial · 4.0mm · 0.47mm/px · z∈[-144,+34]mm · 11 of 23 slices shown (2 of 3)]
[im 1/23]
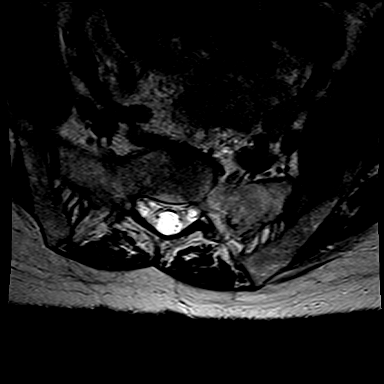
[im 3/23]
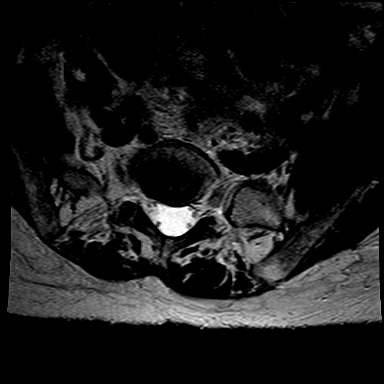
[im 5/23]
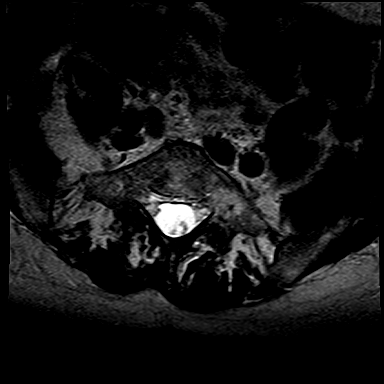
[im 7/23]
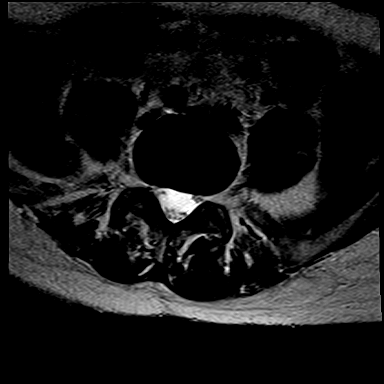
[im 9/23]
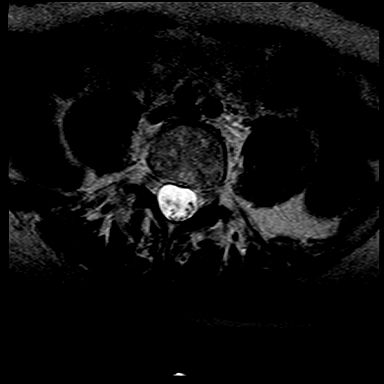
[im 12/23]
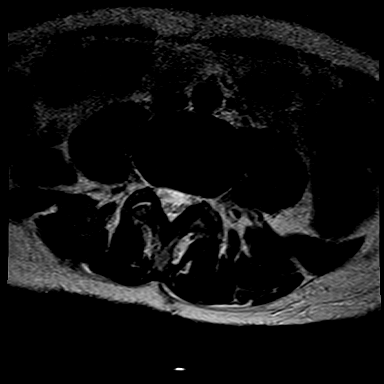
[im 14/23]
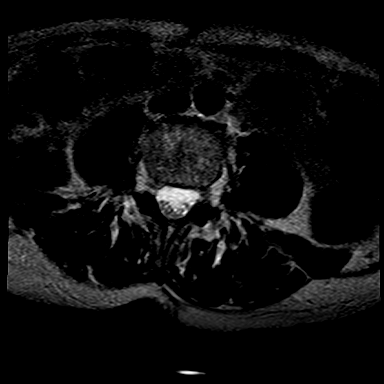
[im 16/23]
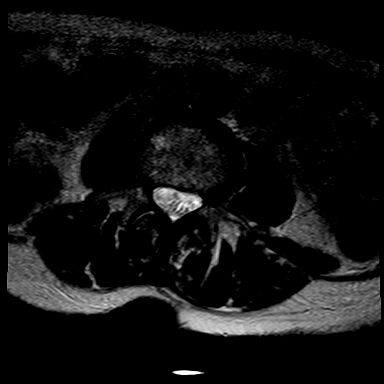
[im 18/23]
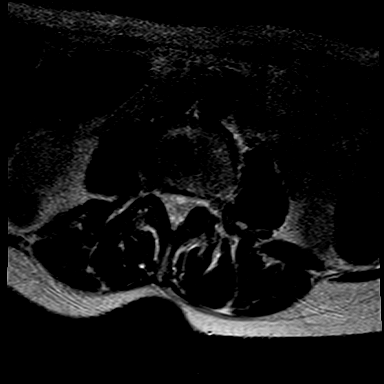
[im 20/23]
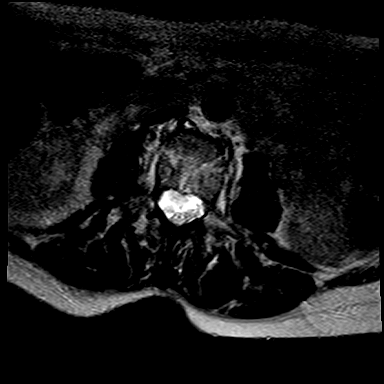
[im 23/23]
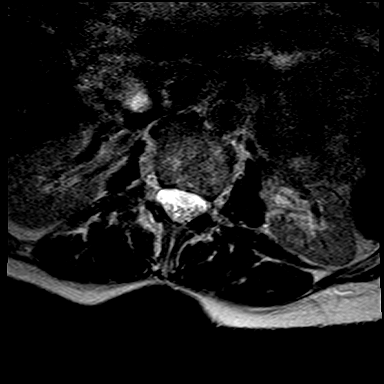

[Series 13: T1 · axial · 4.0mm · 0.47mm/px · z∈[-144,+34]mm · 8 of 23 slices shown (2 of 2)]
[im 1/23]
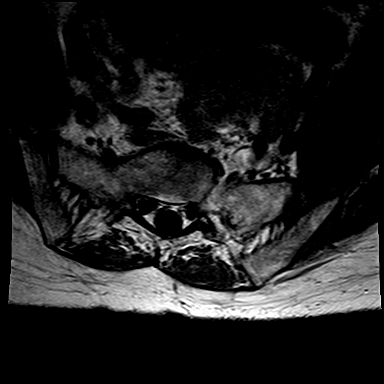
[im 5/23]
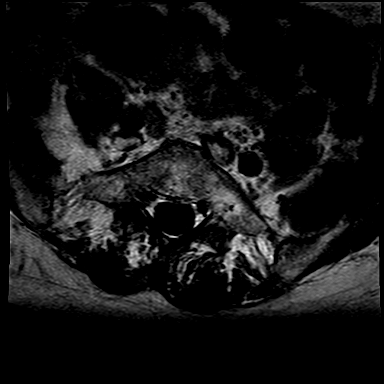
[im 7/23]
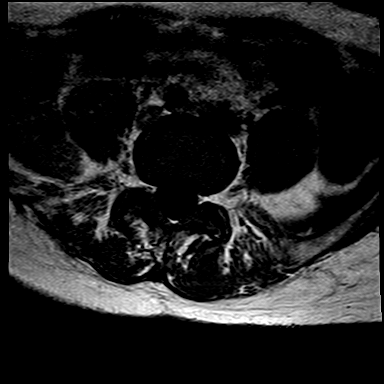
[im 9/23]
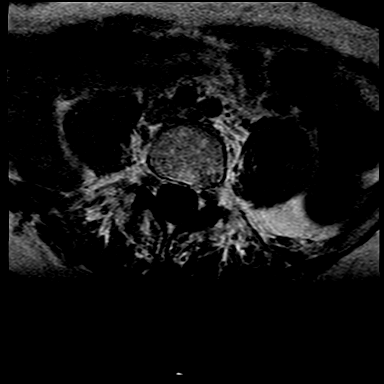
[im 14/23]
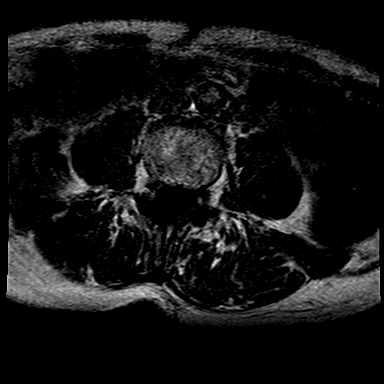
[im 16/23]
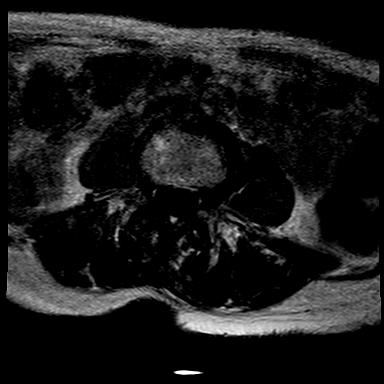
[im 18/23]
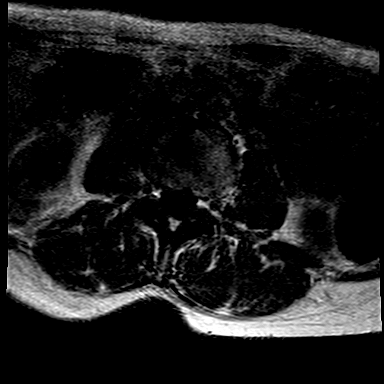
[im 23/23]
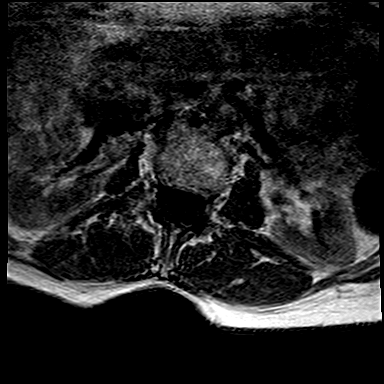

[Series 14: T2 · coronal · 4.0mm · 1.30mm/px · 9 of 20 slices shown (3 of 3)]
[im 1/20]
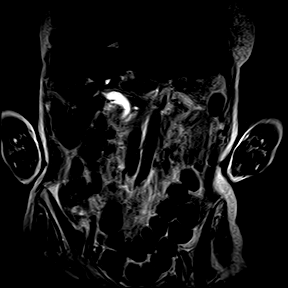
[im 3/20]
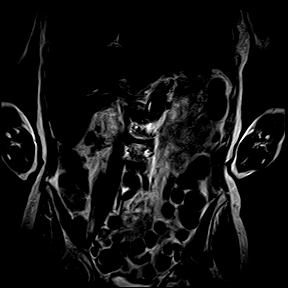
[im 5/20]
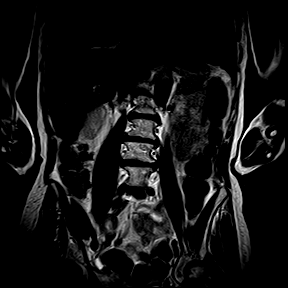
[im 8/20]
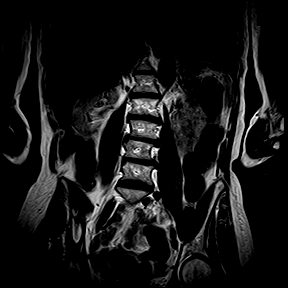
[im 10/20]
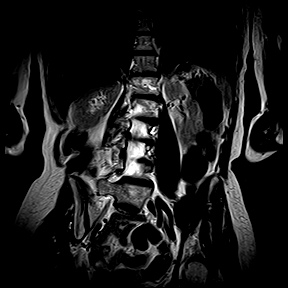
[im 12/20]
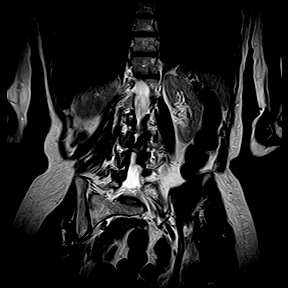
[im 15/20]
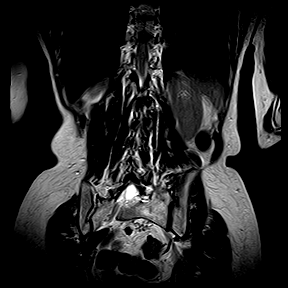
[im 17/20]
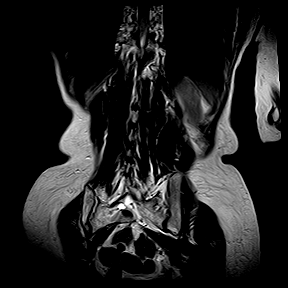
[im 20/20]
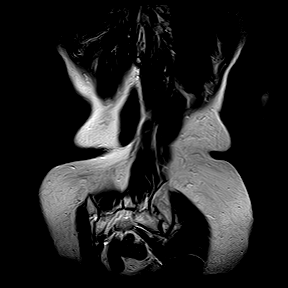

[41 of 48 positions shown; findings below may reference images not displayed]

FINDINGS: Some of the sequences are compromised in quality due to motion.  No acute bony lesions of the lumbar vertebrae are seen.  Transitional lumbosacral junction is noted with the lowest visible disc in the sagittal projection labeled S1-S2 for the purposes of this report.  Lower spinal cord and cauda equina are normal. 

At L2-L3 level, significant degenerative disc disease with bulging annulus and facet arthropathy causing moderate compromise of both lateral recesses and significant compromise of neural foramina, left more than the right.  

At L3-L4 level, severe bilateral facet arthropathy with degenerative disc changes causing significant compromise of both lateral recesses and neural foramina and spinal stenosis with AP diameter measuring 5.9 mm at L3-L4 level.  

At L4-L5 level, severe degenerative disc disease with bulging annulus and facet arthropathy causing significant left foraminal stenosis and moderate right foraminal stenosis.  

At L5-S1 level, mild foraminal stenosis due to degenerative disc changes.  

S1-S2 disc is normal.  Paravertebral soft tissues are normal.
IMPRESSION: 1. No acute bony lesions.  Transitional lumbosacral junction with lowest visible disc in sagittal projection labeled S1-S2. 

2. At L3-L4 level, severe bilateral facet arthropathy with degenerative disc changes causing significant compromise of both lateral recesses and neural foramina and spinal stenosis with AP diameter measuring 5.9 mm at L3-L4 level.  

3. At L4-L5 level, severe degenerative disc disease with bulging annulus and facet arthropathy causing significant left foraminal stenosis and moderate right foraminal stenosis.  

4. Finding at other disc levels are described earlier in detail. 

Electronically Signed by ATTIA, ALSHAZALI at [id number] [DATE]

## 2020-09-27 IMAGING — CR XRAY CERVICAL SPINE [DATE] VIEWS
1 series · 7 of 7 positions shown · non-contrast
Comparison: None available.

﻿EXAM:  [id number]      XRAY CERVICAL SPINE [DATE] VIEWS
INDICATION: Neck pain.

[Series 1: view not recorded · 0.17mm/px · 7 of 7 slices shown]
[im 1/7]
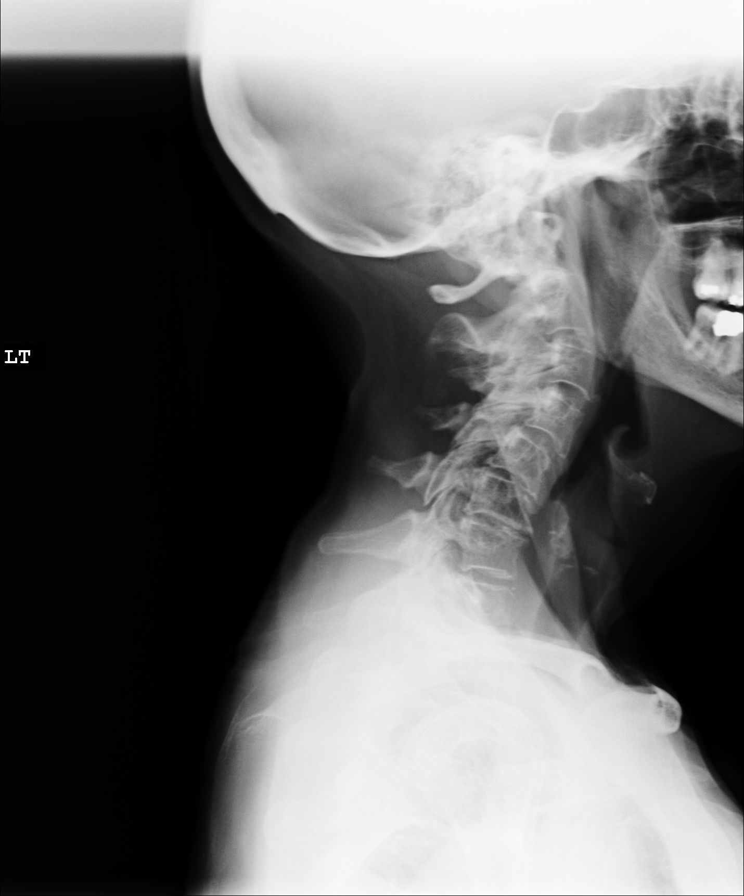
[im 2/7]
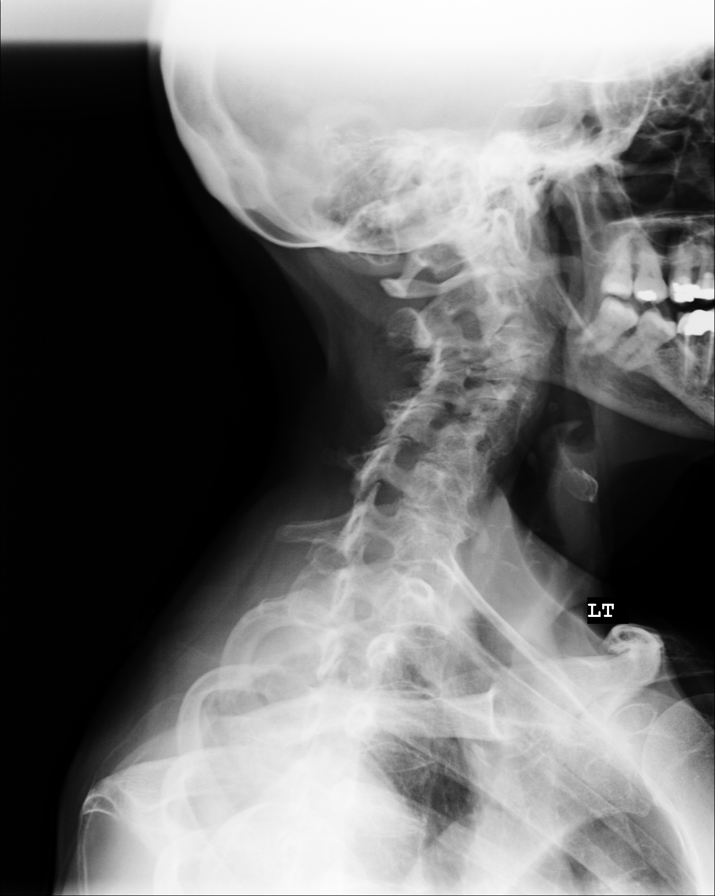
[im 3/7]
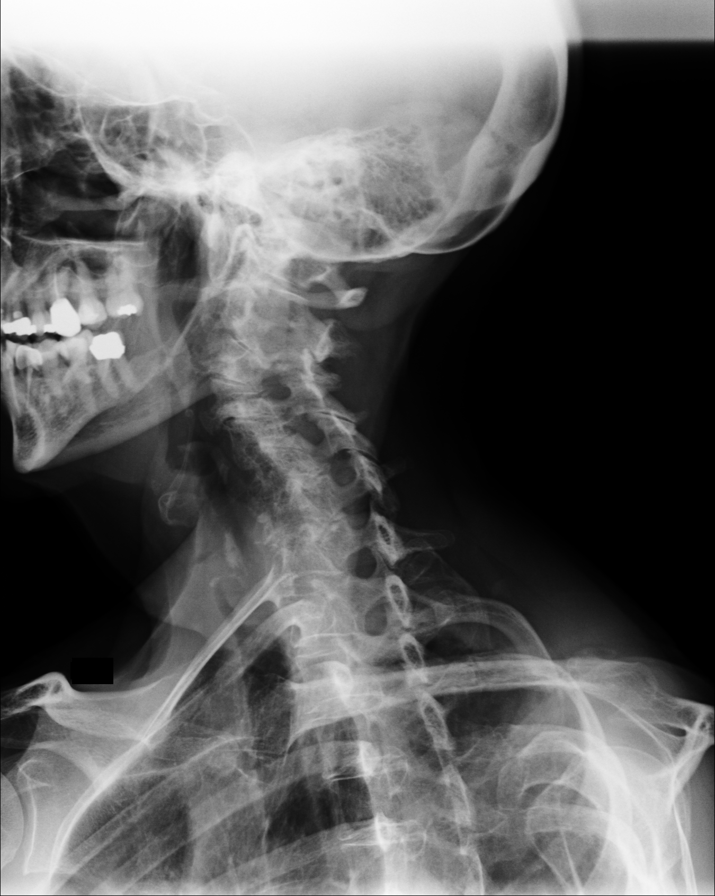
[im 4/7]
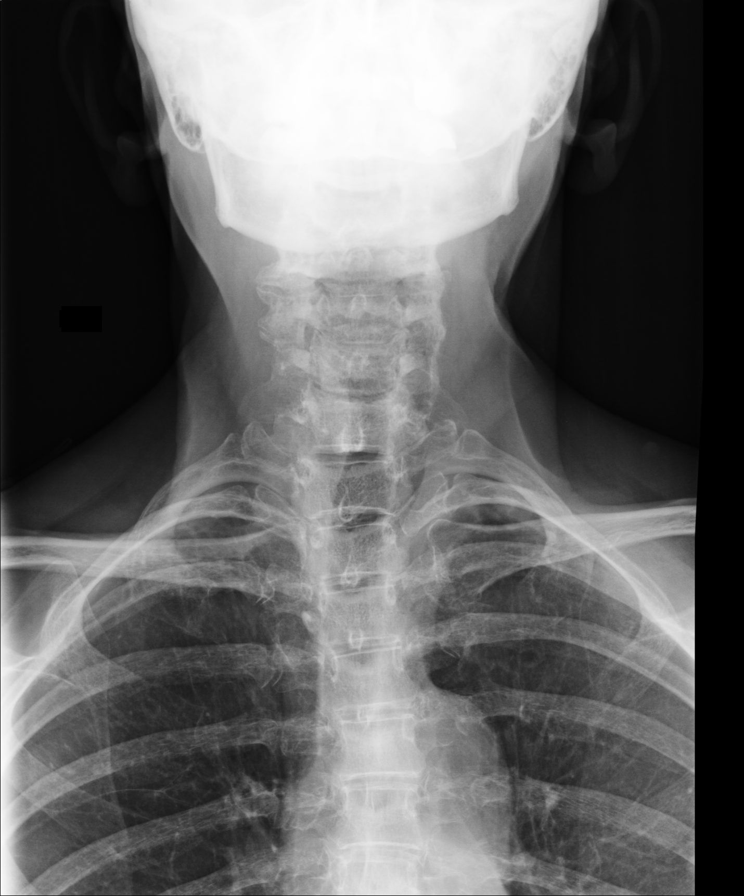
[im 5/7]
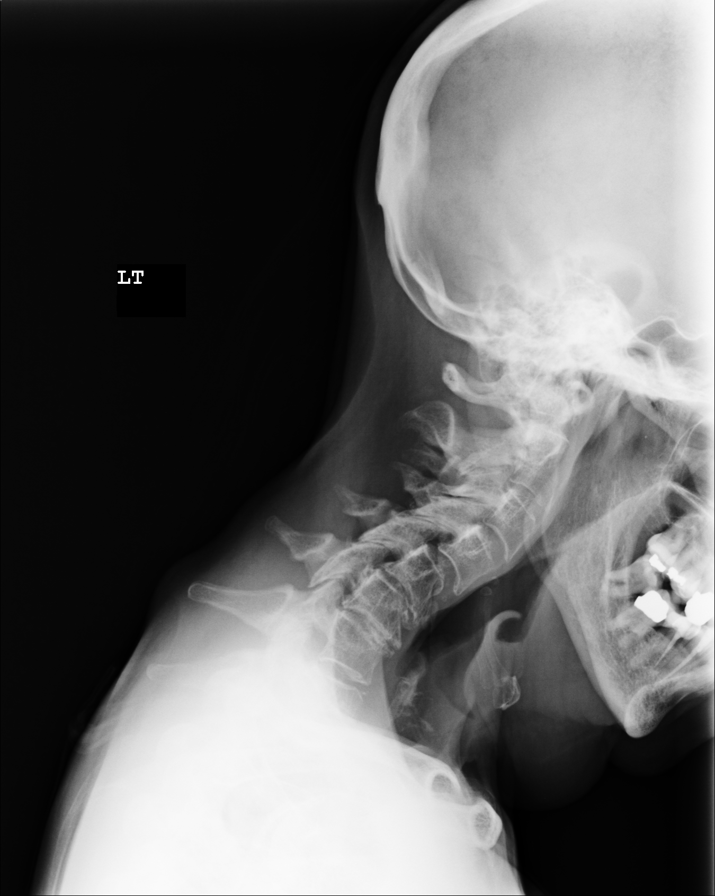
[im 6/7]
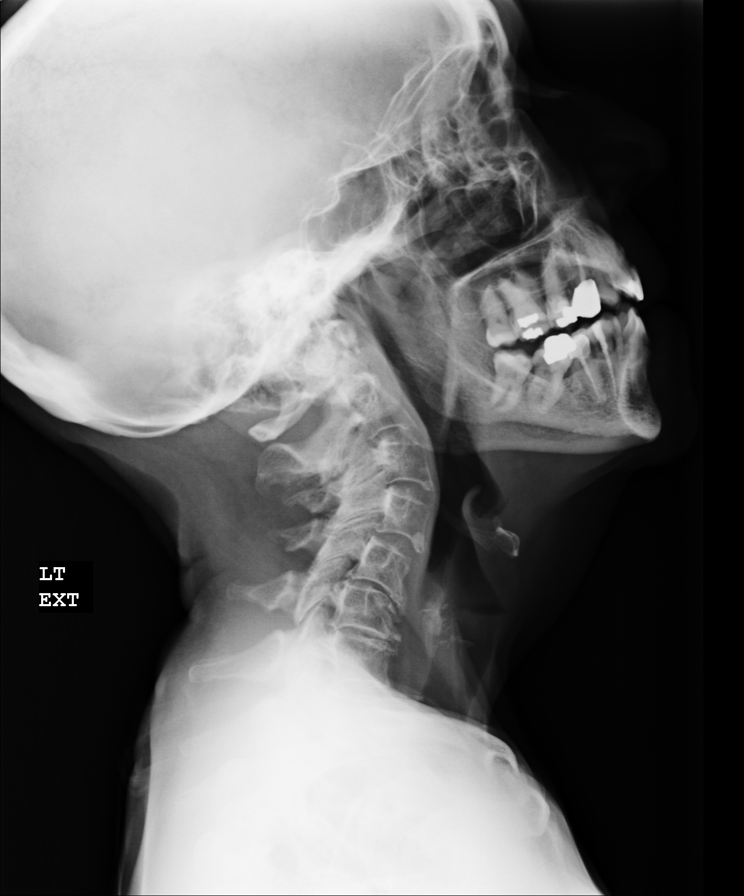
[im 7/7]
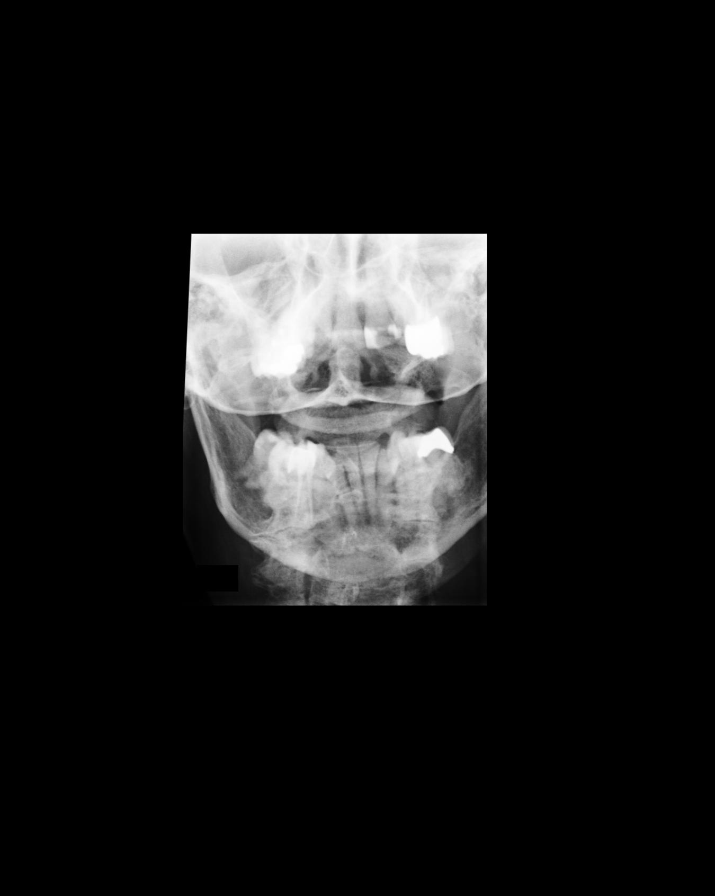

[7 of 7 positions shown; findings below may reference images not displayed]

FINDINGS: Reversal of cervical lordosis is likely positional. There is no acute fracture or subluxation. There is moderate degenerative disc disease at C5-6 and C6-7 levels. Minimal anterolisthesis of C4 on C5 and C5 on C6 vertebral body is identified without significant change on the flexion and extension views. There is no significant neural foraminal stenosis at any level. Atlantoaxial articulation is well maintained. There is no prevertebral soft tissue swelling.
IMPRESSION: Multilevel degenerative changes as detailed above.

## 2020-09-27 IMAGING — MR MRI THORACIC SPINE WITHOUT CONTRAST
4 of 5 series · 26 of 48 positions shown · IV contrast (gadolinium)
Comparison: None available.

﻿EXAM:  [id number]   MRI THORACIC SPINE WITHOUT CONTRAST
INDICATION: Back pain.
TECHNIQUE: Multiplanar multisequential MRI of the thoracic spine was performed without gadolinium contrast.

[Series 5: T2 · sagittal · 4.0mm · 0.78mm/px · 11 of 13 slices shown (1 of 2)]
[im 1/13]
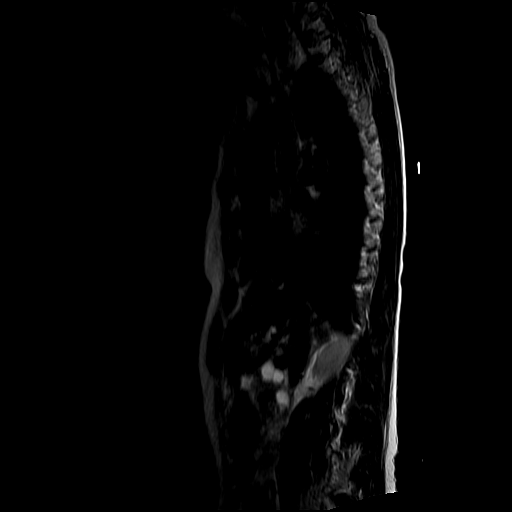
[im 2/13]
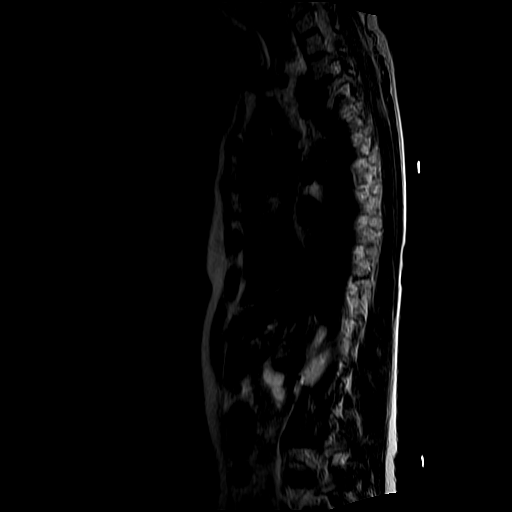
[im 3/13]
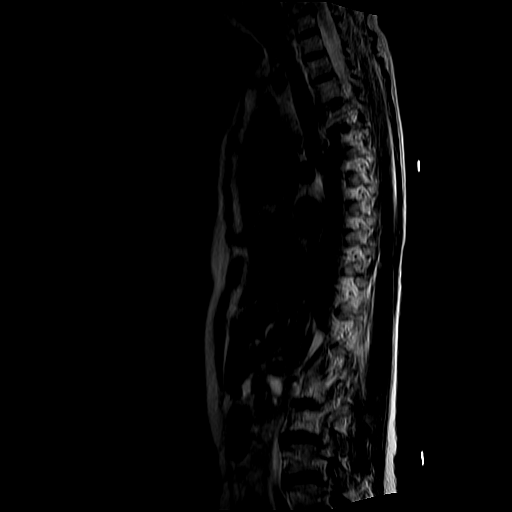
[im 4/13]
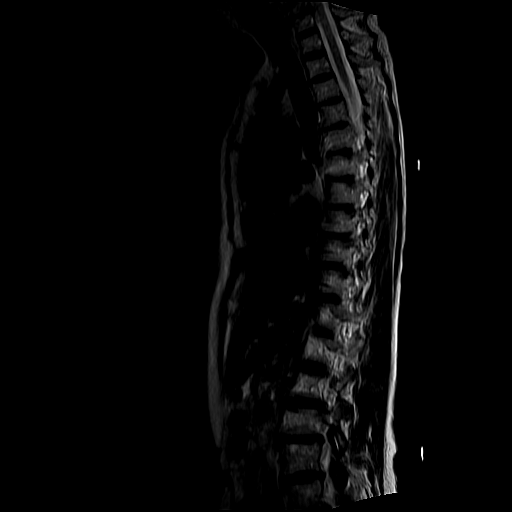
[im 5/13]
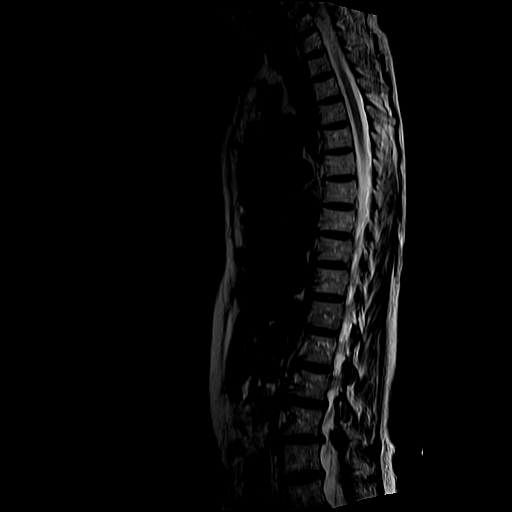
[im 7/13]
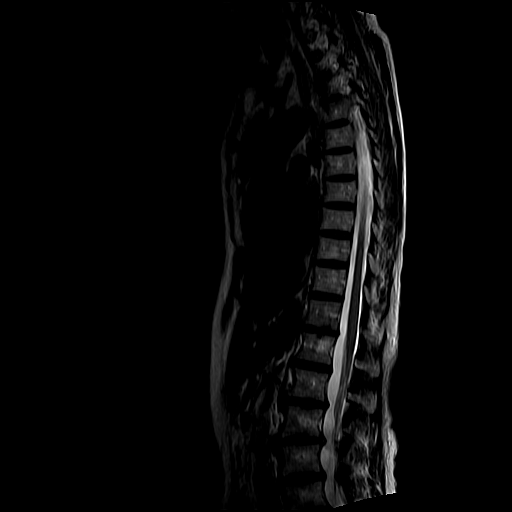
[im 8/13]
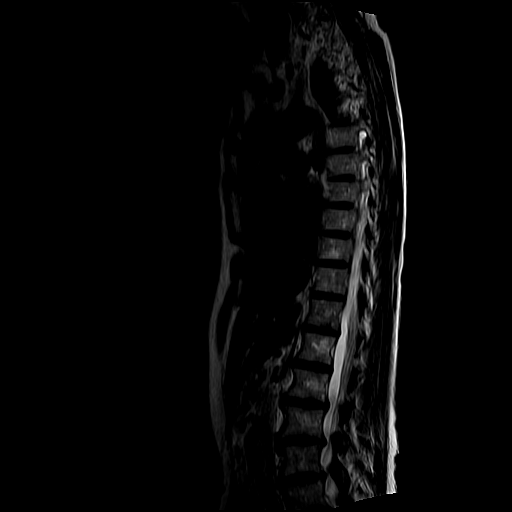
[im 9/13]
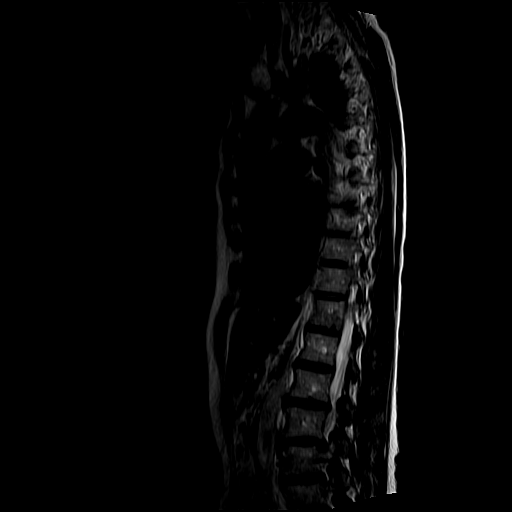
[im 10/13]
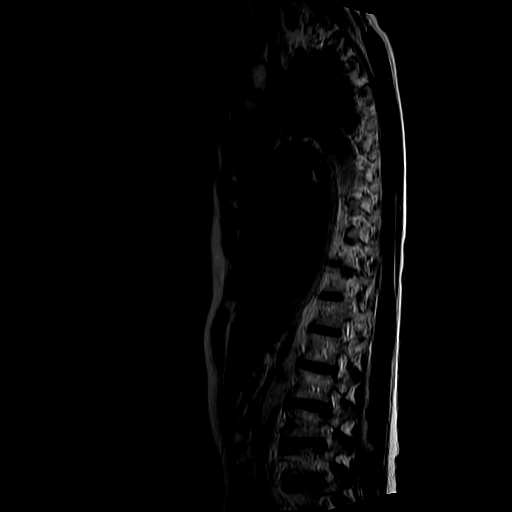
[im 11/13]
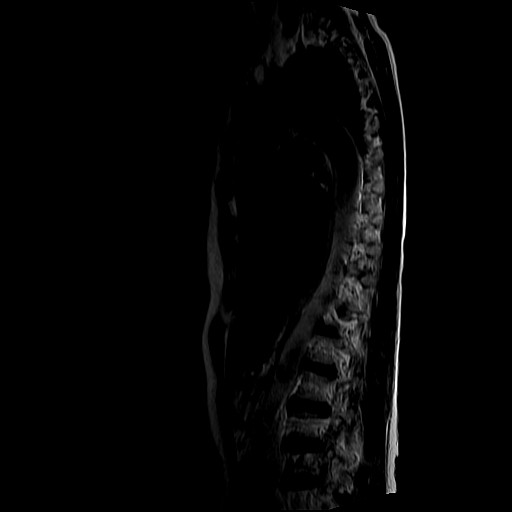
[im 13/13]
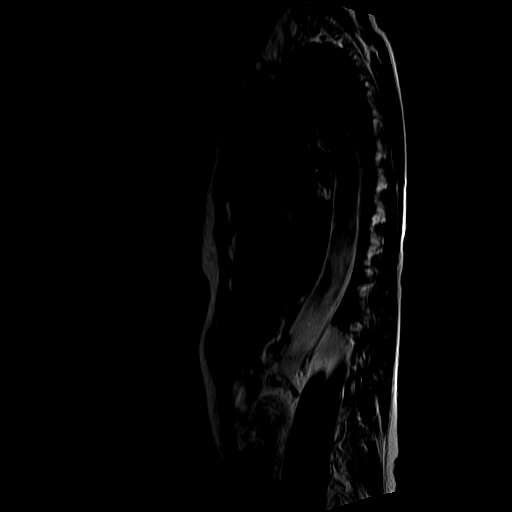

[Series 6: T1 · sagittal · 4.0mm · 0.78mm/px · 3 of 13 slices shown (1 of 2)]
[im 2/13]
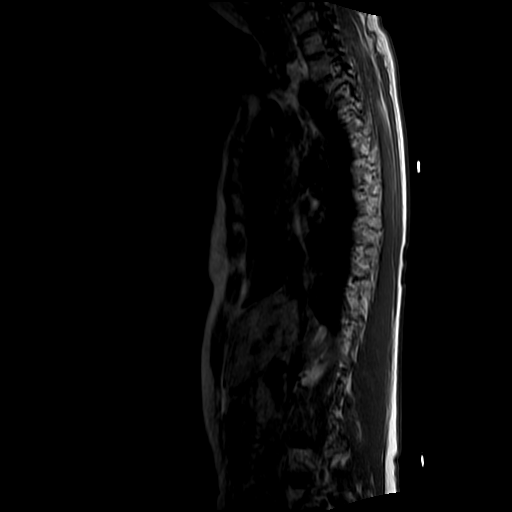
[im 7/13]
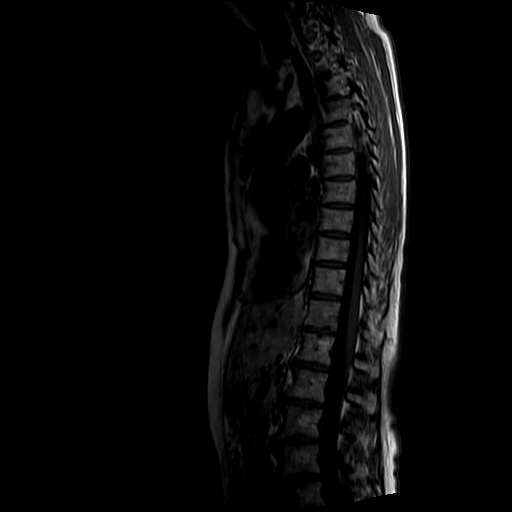
[im 11/13]
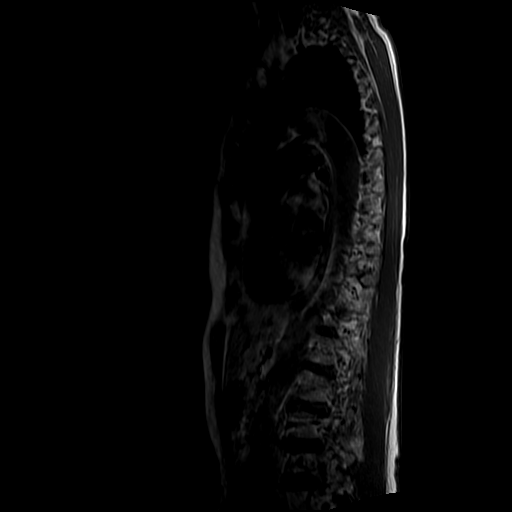

[Series 9: T2 · axial · 4.0mm · 0.62mm/px · z∈[-329,-97]mm · 9 of 12 slices shown (2 of 2)]
[im 1/12]
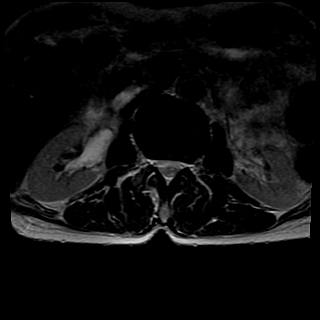
[im 2/12]
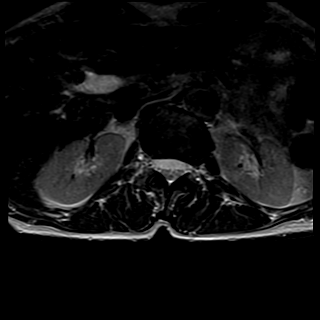
[im 3/12]
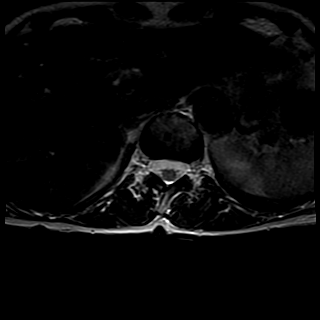
[im 5/12]
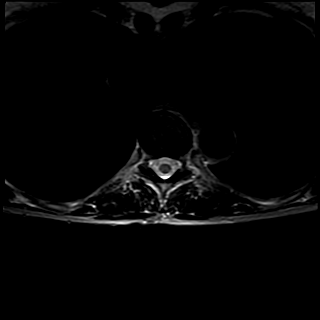
[im 6/12]
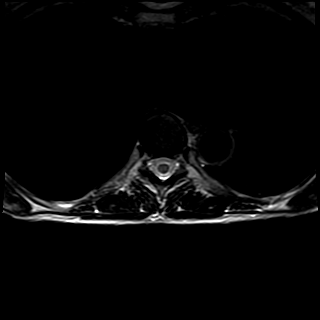
[im 7/12]
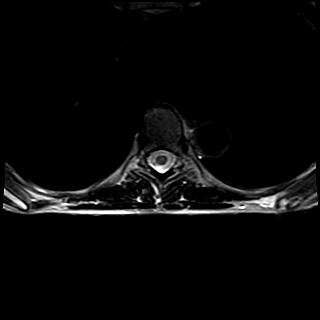
[im 9/12]
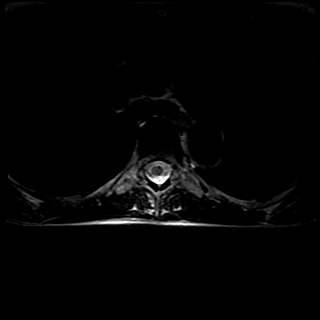
[im 10/12]
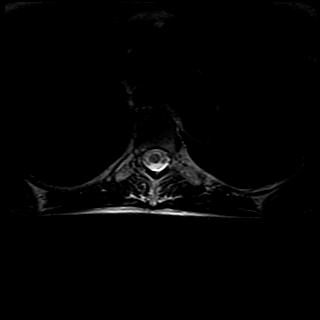
[im 12/12]
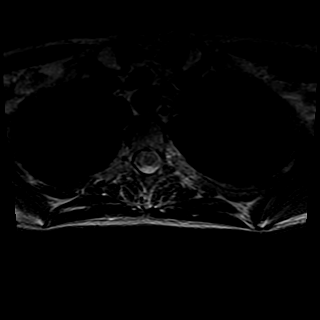

[Series 10: T1 · axial · 4.0mm · 0.62mm/px · z∈[-303,-127]mm · 3 of 12 slices shown (2 of 2)]
[im 2/12]
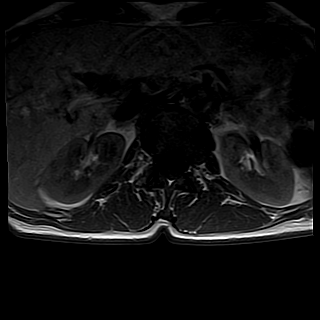
[im 6/12]
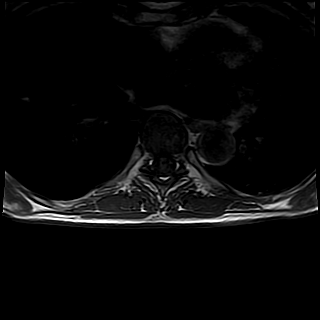
[im 10/12]
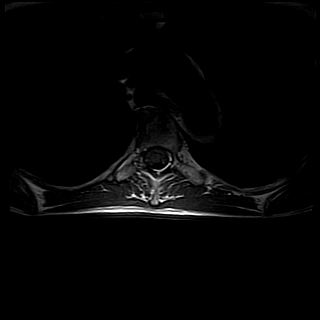

[26 of 48 positions shown; findings below may reference images not displayed]

FINDINGS: Vertebral bodies are normal in height, alignment and signal intensity. There is no acute fracture or subluxation. Visualized spinal cord is normal in signal intensity without evidence of compression at any level.

There is no significant disc herniation, spinal canal or neural foraminal stenosis at any level.

Paraspinal soft tissues are also unremarkable. There is no pleural effusion.
IMPRESSION: Unremarkable exam.

## 2020-09-27 IMAGING — MR MRI CERVICAL SPINE WITHOUT CONTRAST
6 series · 48 of 48 positions shown · IV contrast (gadolinium)
Comparison: None available.

﻿EXAM:  [id number]   MRI CERVICAL SPINE WITHOUT CONTRAST
INDICATION: Bilateral upper extremity pain and weakness.
TECHNIQUE: Multiplanar multisequential MRI of the cervical spine was performed without gadolinium contrast.

[Series 4: s-map · sagittal · 8.8mm · 4.38mm/px · 29 of 100 slices shown]
[im 1/100]
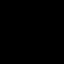
[im 4/100]
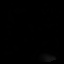
[im 8/100]
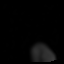
[im 11/100]
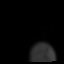
[im 15/100]
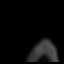
[im 18/100]
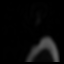
[im 22/100]
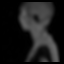
[im 25/100]
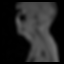
[im 29/100]
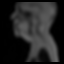
[im 32/100]
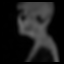
[im 36/100]
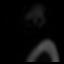
[im 39/100]
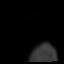
[im 43/100]
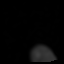
[im 46/100]
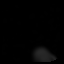
[im 50/100]
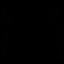
[im 54/100]
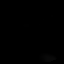
[im 57/100]
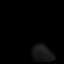
[im 61/100]
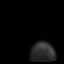
[im 64/100]
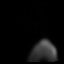
[im 68/100]
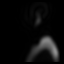
[im 71/100]
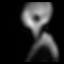
[im 75/100]
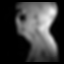
[im 78/100]
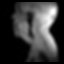
[im 82/100]
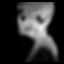
[im 85/100]
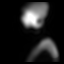
[im 89/100]
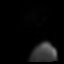
[im 92/100]
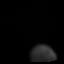
[im 96/100]
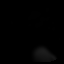
[im 100/100]
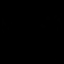

[Series 5: T2 · sagittal · 3.0mm · 0.78mm/px · 3 of 11 slices shown (1 of 2)]
[im 1/11]
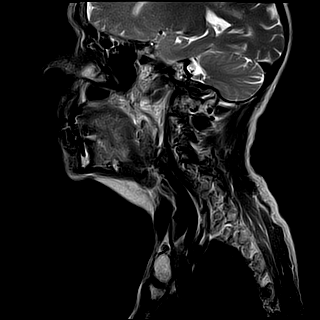
[im 6/11]
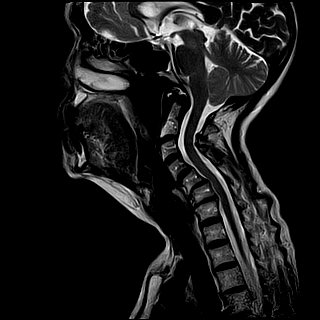
[im 11/11]
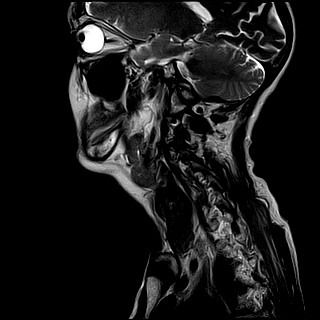

[Series 6: T1 · sagittal · 3.0mm · 0.49mm/px · 3 of 11 slices shown]
[im 1/11]
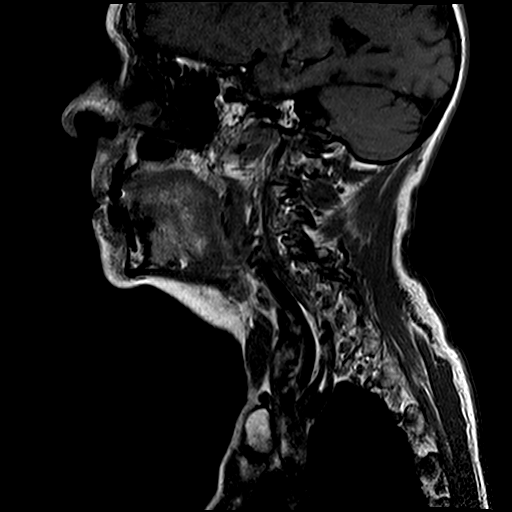
[im 6/11]
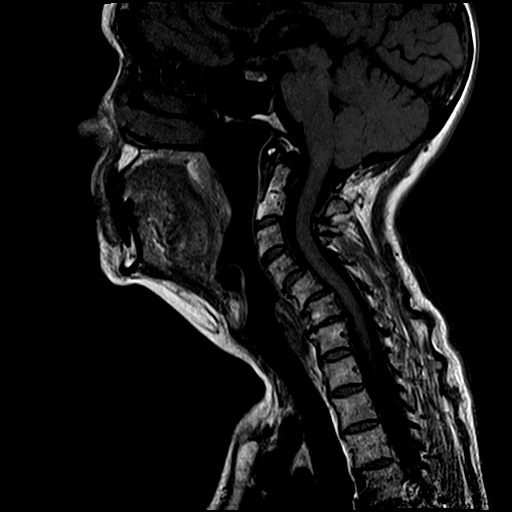
[im 11/11]
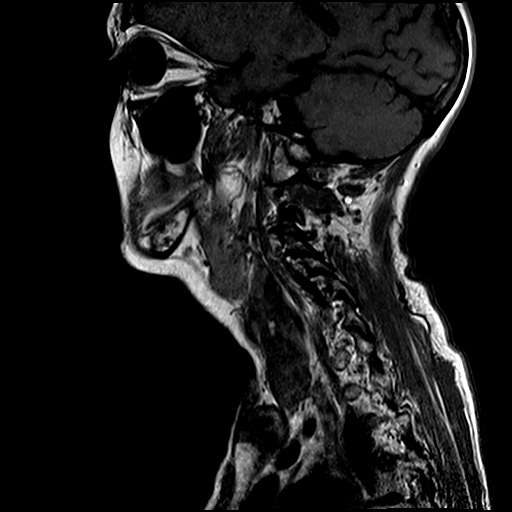

[Series 8: STIR · sagittal · 3.0mm · 0.49mm/px · 3 of 11 slices shown]
[im 1/11]
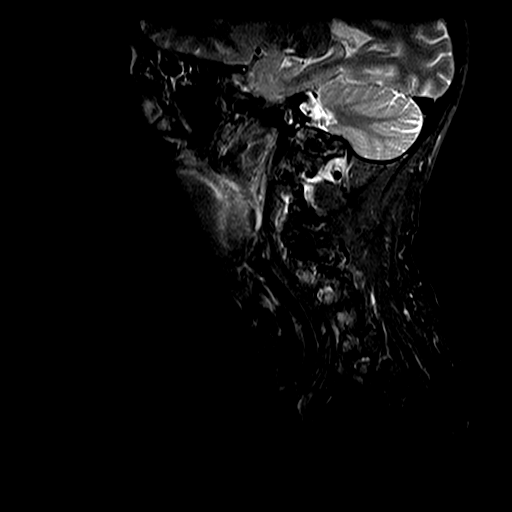
[im 6/11]
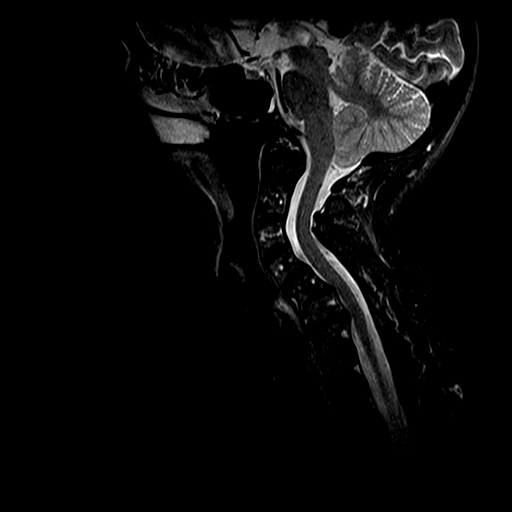
[im 11/11]
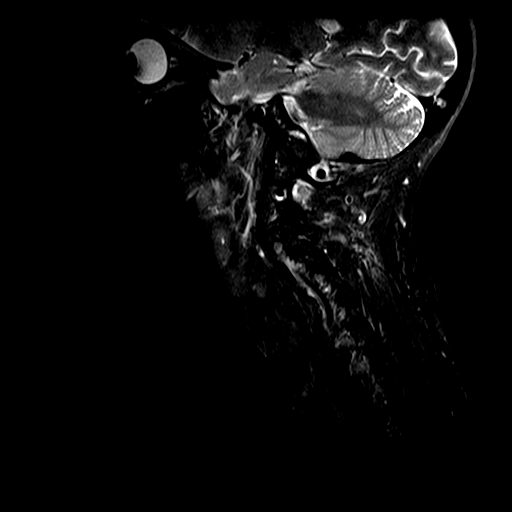

[Series 9: T2-star · axial · 3.5mm · 0.31mm/px · z∈[-76,-9]mm · 5 of 18 slices shown]
[im 1/18]
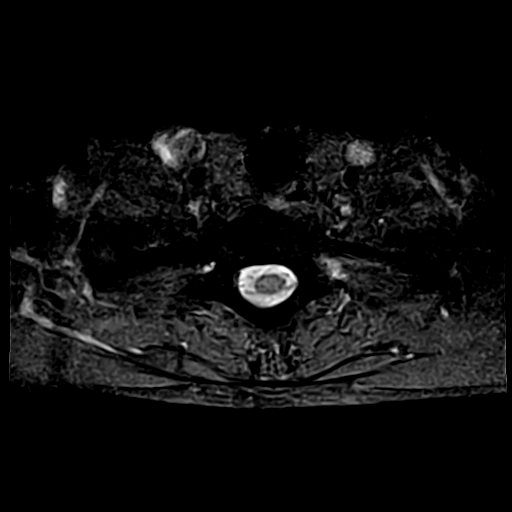
[im 5/18]
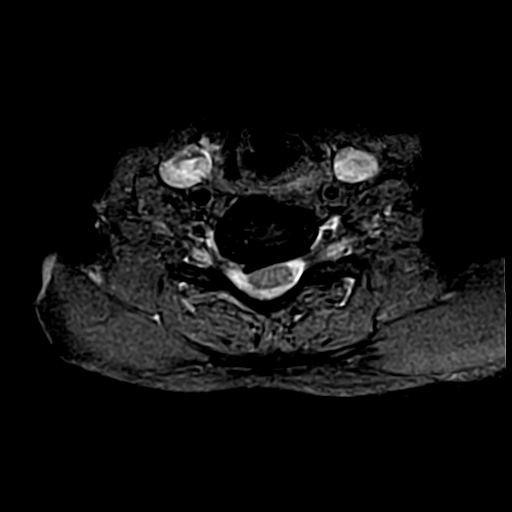
[im 9/18]
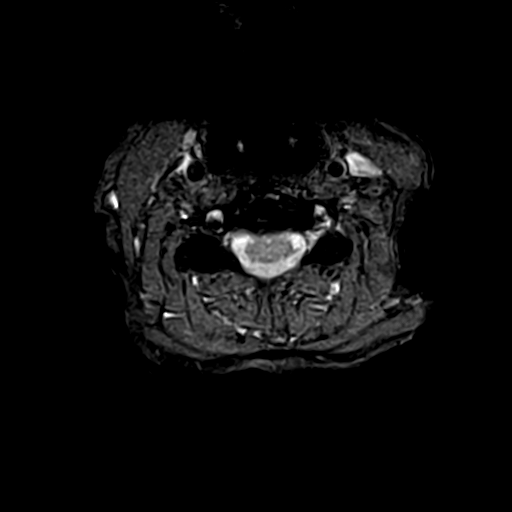
[im 13/18]
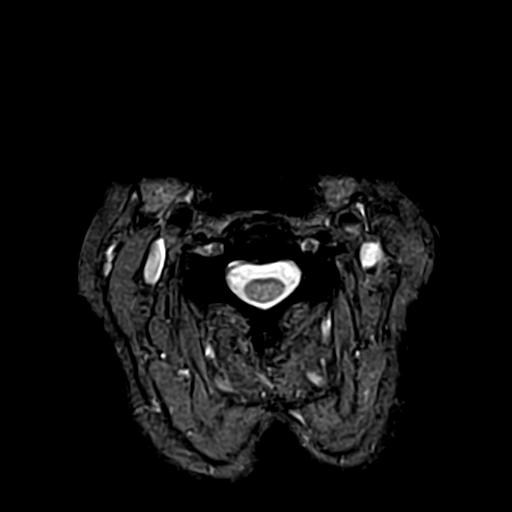
[im 18/18]
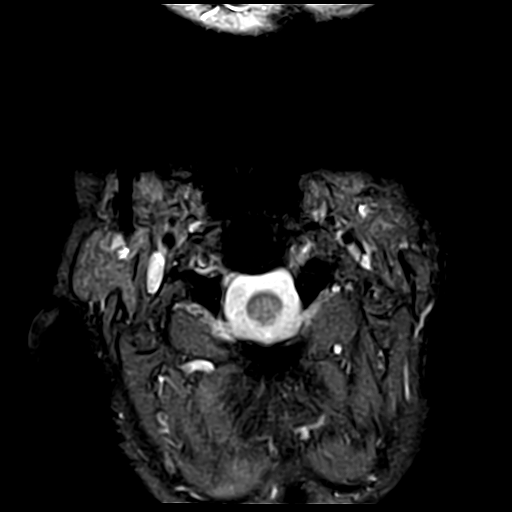

[Series 10: T2 · axial · 3.5mm · 0.31mm/px · z∈[-76,-9]mm · 5 of 18 slices shown (2 of 2)]
[im 1/18]
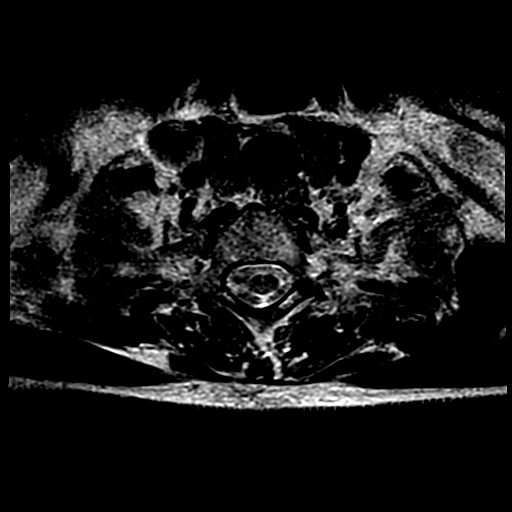
[im 5/18]
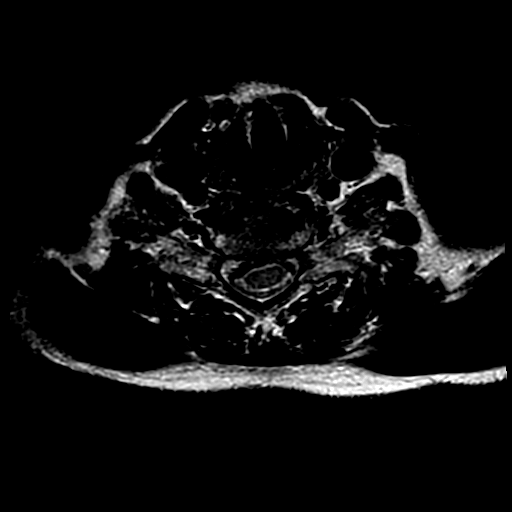
[im 9/18]
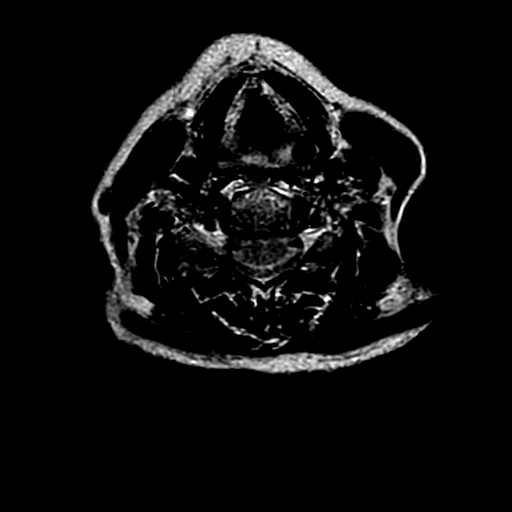
[im 13/18]
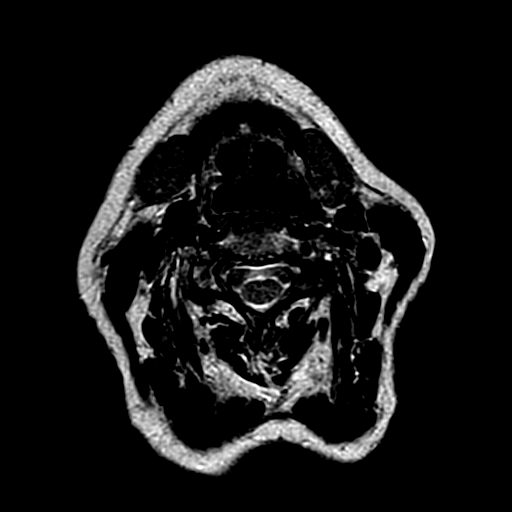
[im 18/18]
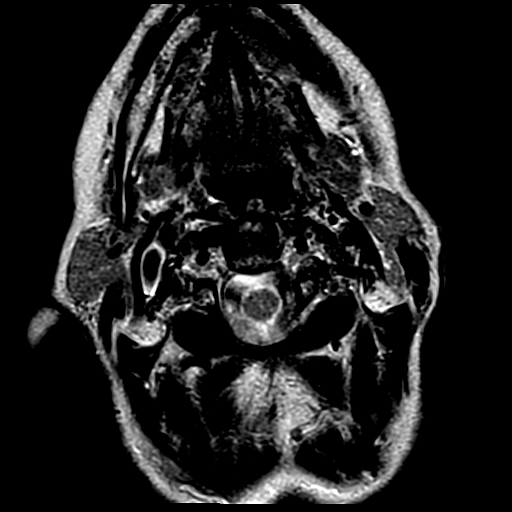

[48 of 48 positions shown; findings below may reference images not displayed]

FINDINGS: Reversal of cervical lordosis is likely positional. Bone marrow signal intensity is normal. There is no acute fracture or subluxation. Visualized spinal cord is normal in signal intensity without evidence of compression at any level.

C2-3 and C3-4 levels are unremarkable.

At C4-5 level, there is minimal anterolisthesis of C4 on C5 vertebral body. There is no significant disc herniation or neural foraminal stenosis.

At C5-6 level, there is minimal anterolisthesis of C5 on C6 vertebral body. There is a minimal bulging annulus with near complete effacement of the ventral CSF. There is no significant neural foraminal stenosis.

At C6-7 level, there is a small broad-based central disc osteophyte complex with near complete effacement of the ventral CSF. There is no significant neural foraminal stenosis.

At C7-T1 level, there is mild left neural foraminal stenosis from uncovertebral joint hypertrophy.

Paraspinal soft tissues are unremarkable.
IMPRESSION: 1. Reversal of cervical lordosis, likely positional. 

2. Minimal anterolisthesis of C4 on C5 and C5 on C6 vertebral body.

3. No significant disc herniation, spinal canal or neural foraminal stenosis at any level.

## 2020-09-27 IMAGING — CR XRAY LUMBAR SPINE [DATE] VIEWS
1 series · 5 of 5 positions shown · non-contrast
Comparison: Radiographs dated 03/31/2020.

﻿EXAM:  [id number]      XRAY LUMBAR SPINE [DATE] VIEWS
INDICATION: Lower back pain.

[Series 1: view not recorded · 0.17mm/px · 5 of 5 slices shown]
[im 1/5]
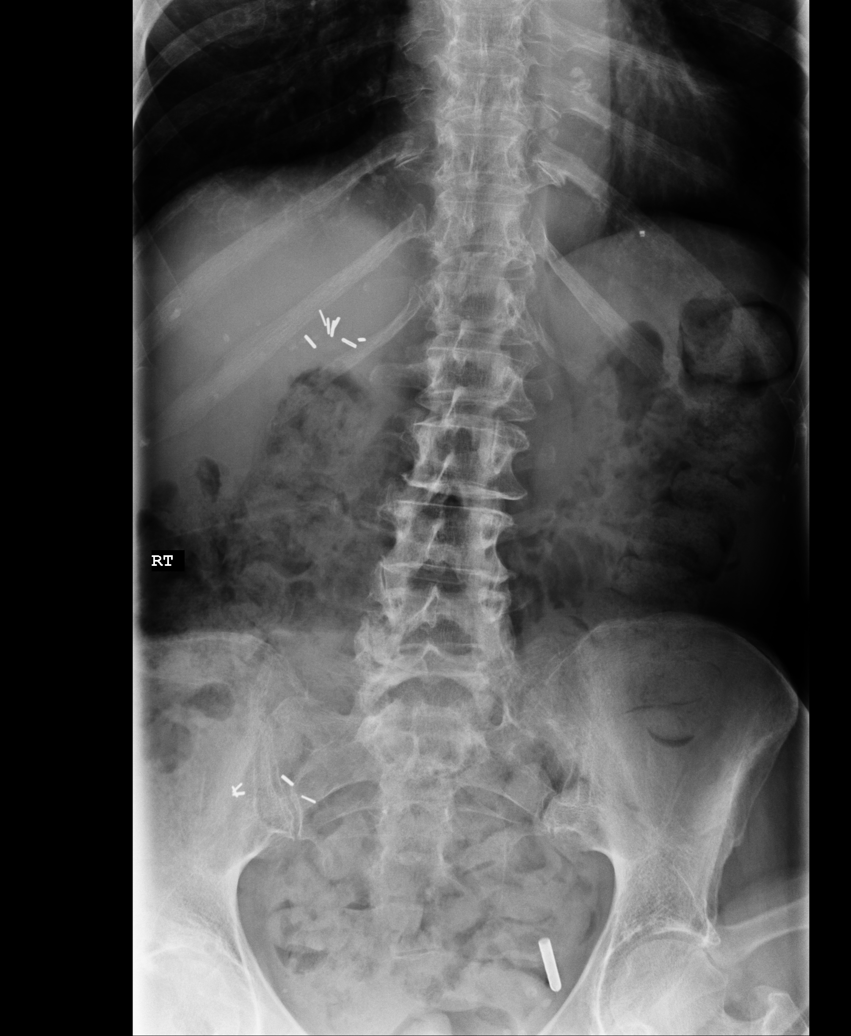
[im 2/5]
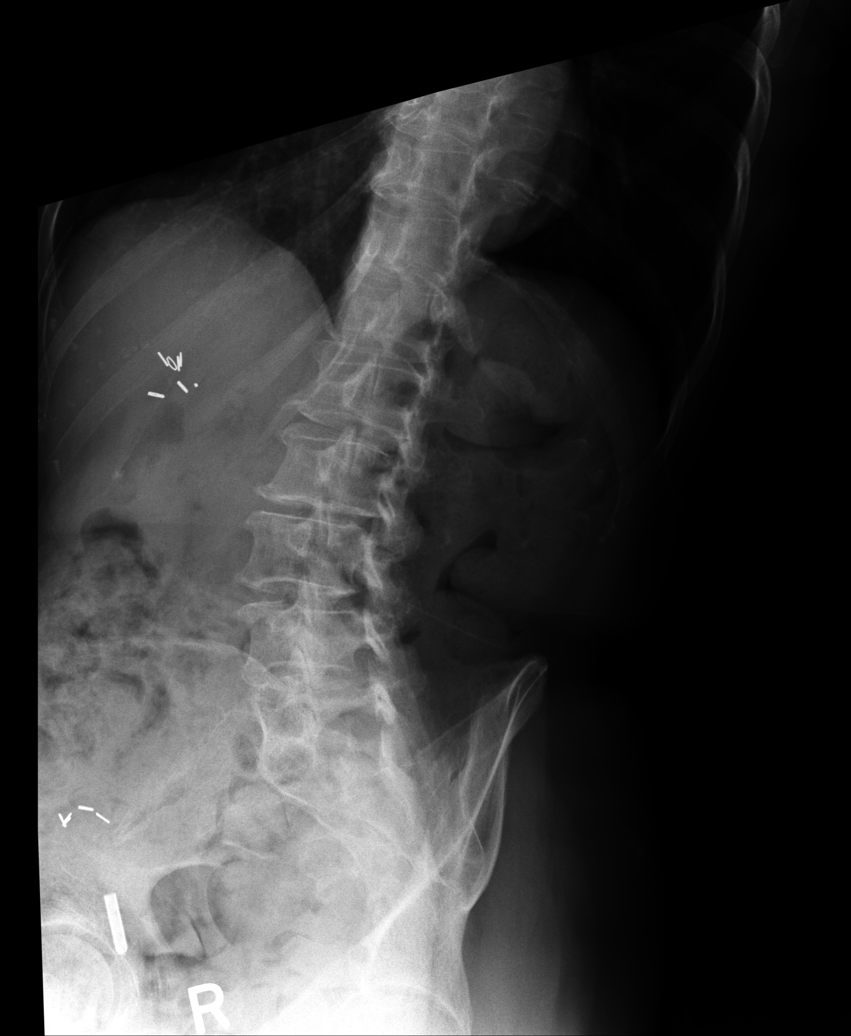
[im 3/5]
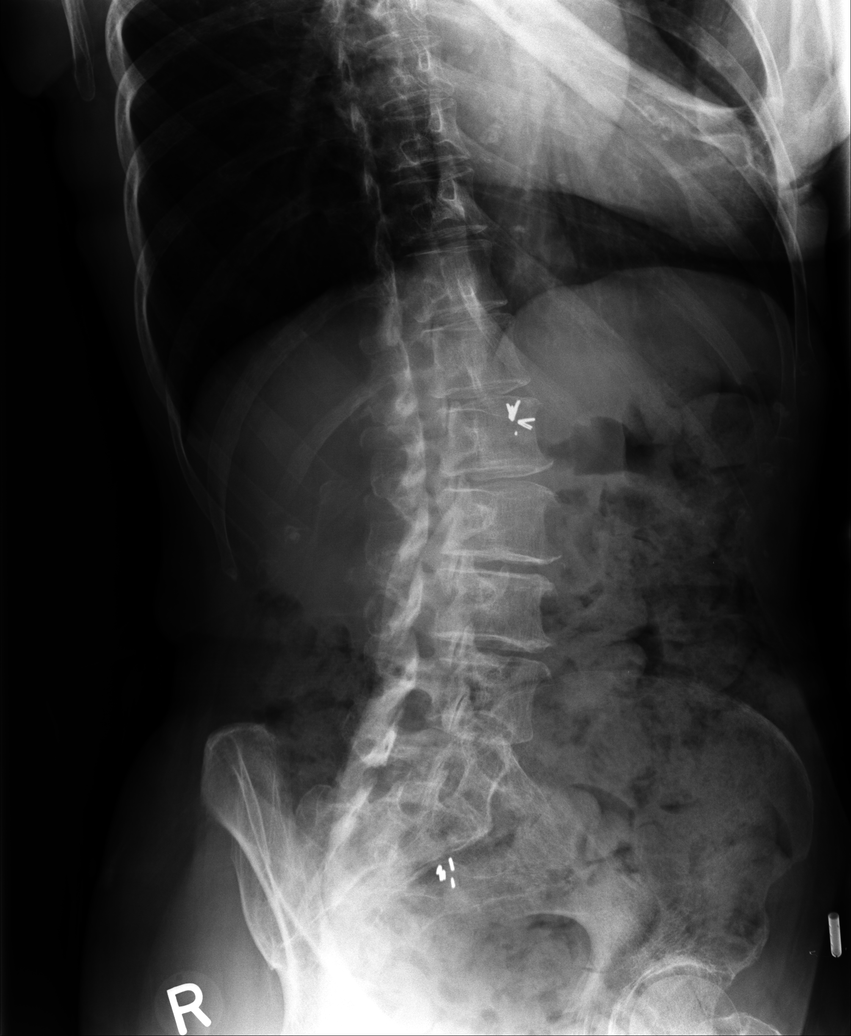
[im 4/5]
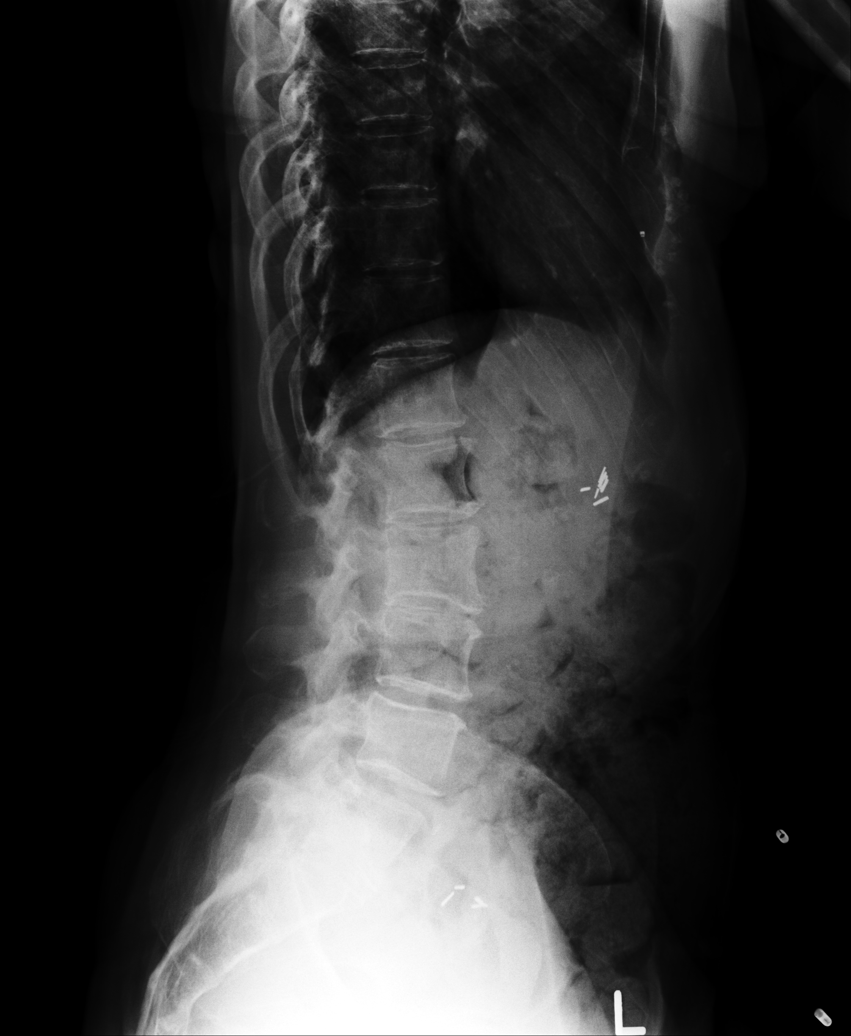
[im 5/5]
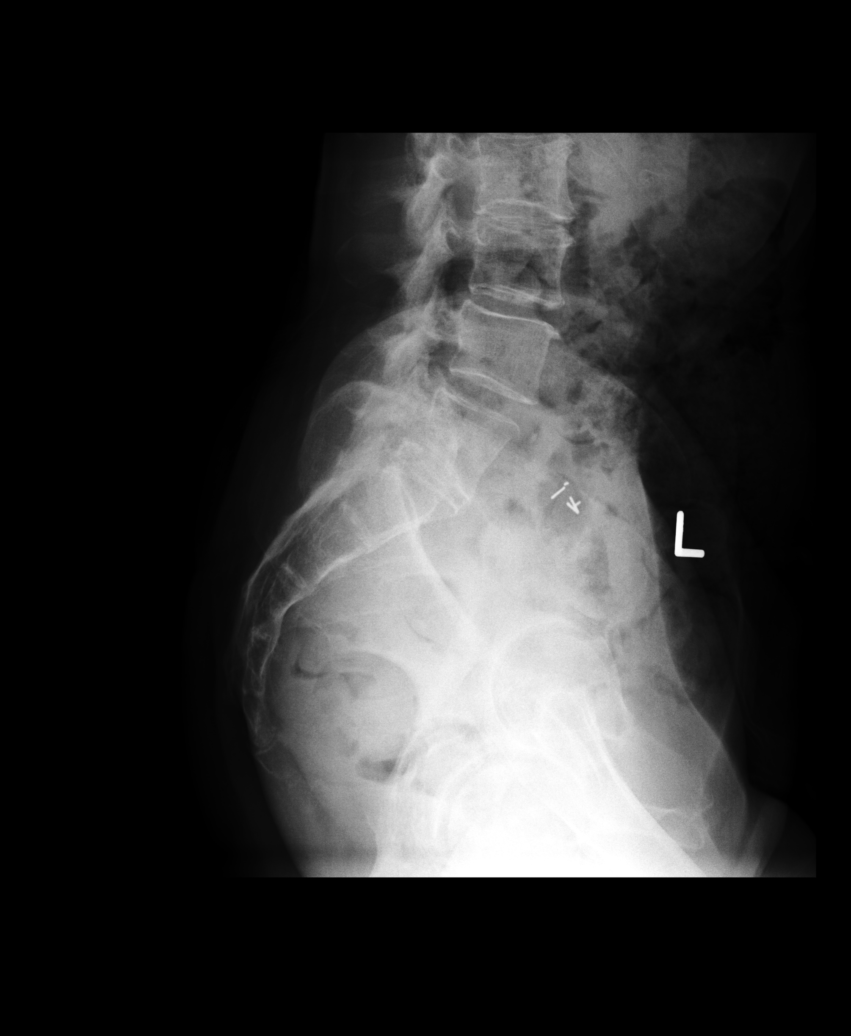

[5 of 5 positions shown; findings below may reference images not displayed]

FINDINGS: Vertebral bodies are normal in height and alignment. There is no acute fracture or subluxation. There is mild-to-moderate degenerative disc disease at L2-3 and L3-4 levels. There is also moderate facet arthropathy within the lower lumbar spine. There is no definite pars defect on oblique views. Paraspinal soft tissues are unremarkable.
IMPRESSION: Multilevel degenerative changes as detailed above.

## 2020-10-12 IMAGING — MG 3D SCREENING MAMMO BIL W/CAD & TOMO
3 series · 5 of 11 positions shown · non-contrast
Comparison: 08/07/2020

------------- REPORT [id number] -------------
﻿

AINOOR MADKHALI,PA
EXAM:  3D BILATERAL ANNUAL SCREENING DIGITAL MAMMOGRAM WITH CAD AND TOMOSYNTHESIS
INDICATION: Screening.

[R]
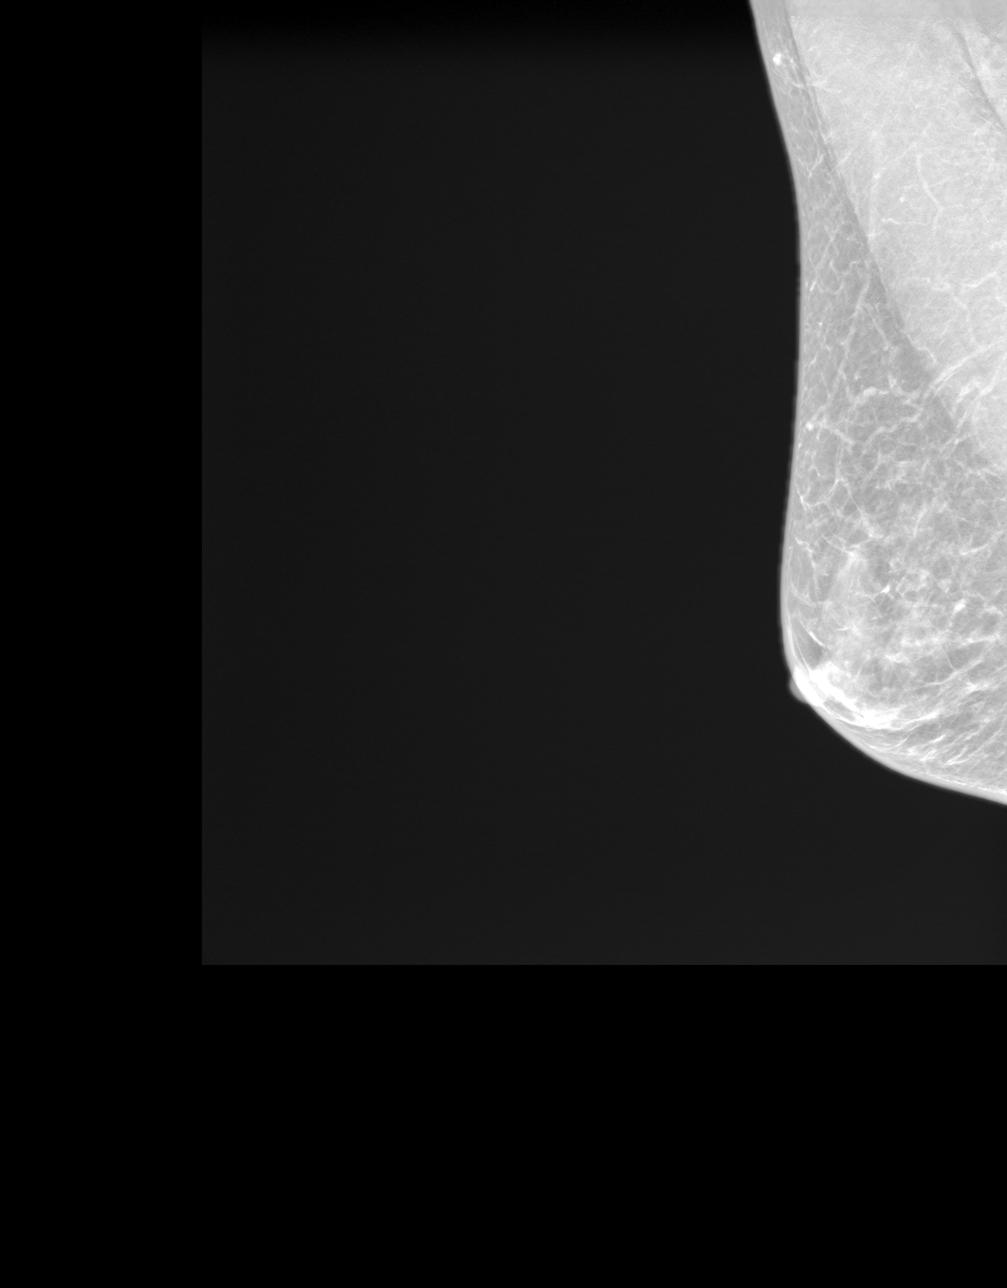

[R CC tomo · right · 0.10mm/px · 2 of 2 slices shown]
[im 1/2]
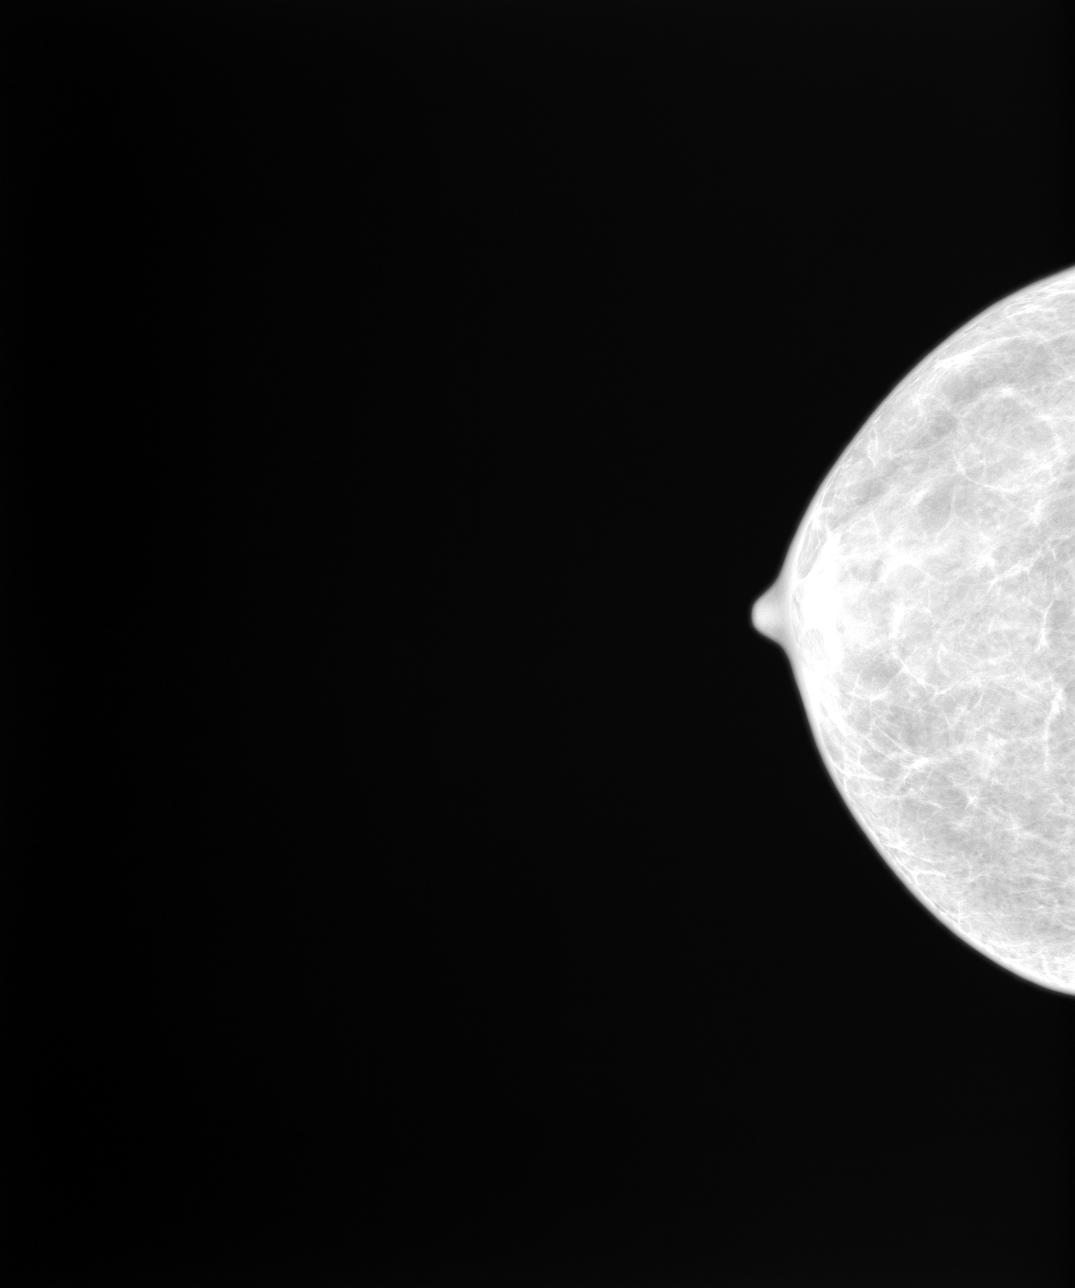
[im 2/2]
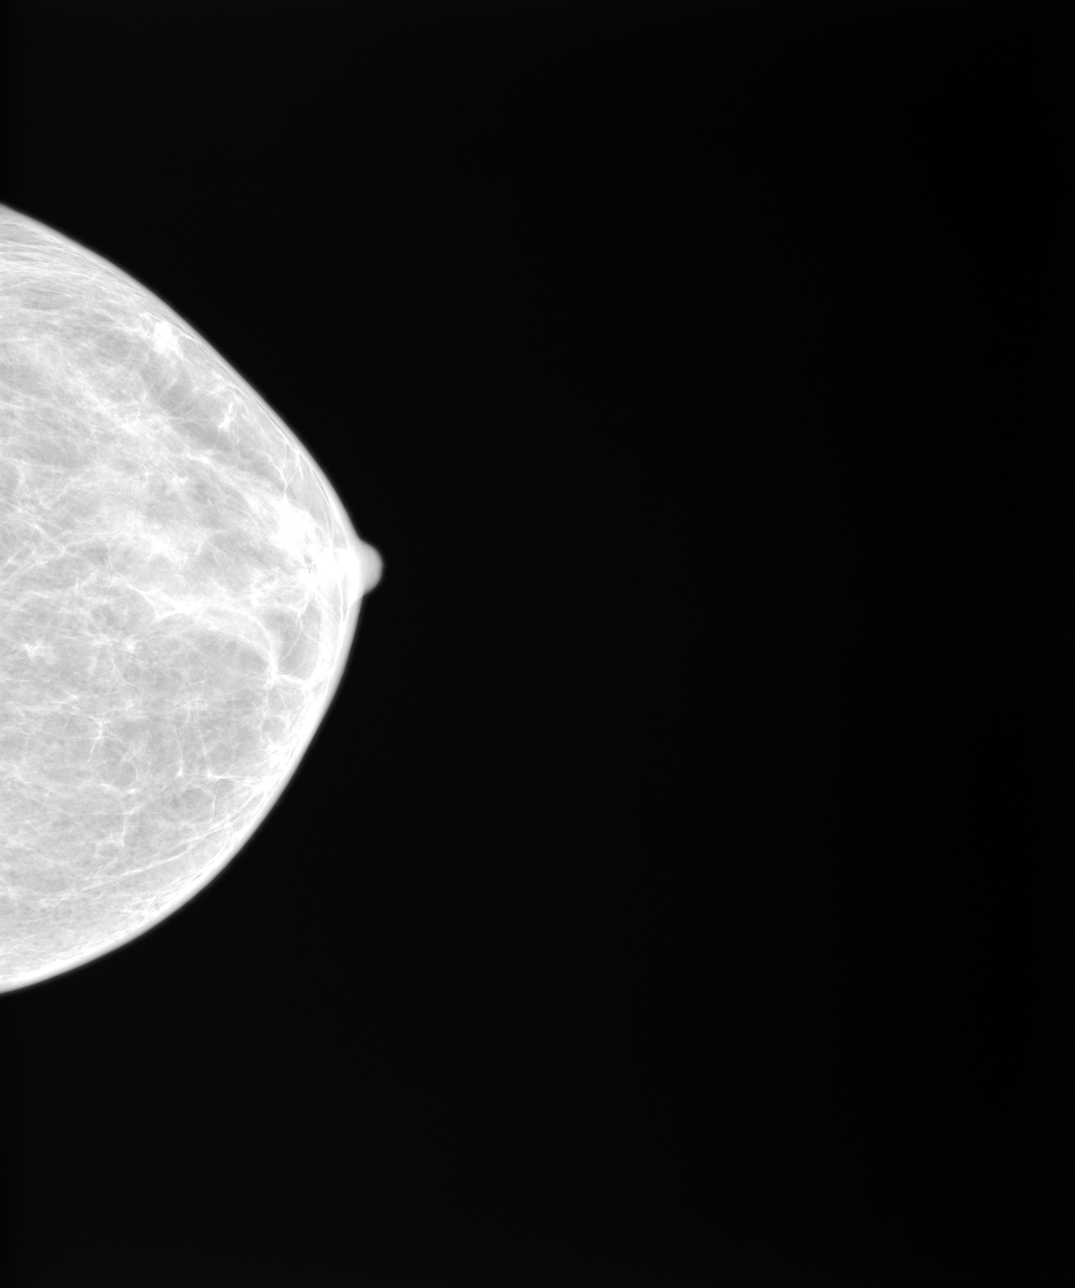

[3D SCREENING MAMMO BIL W/CAD & TOMO tomo · 2 acquisitions, 2 frames shown]
[im 2/2]
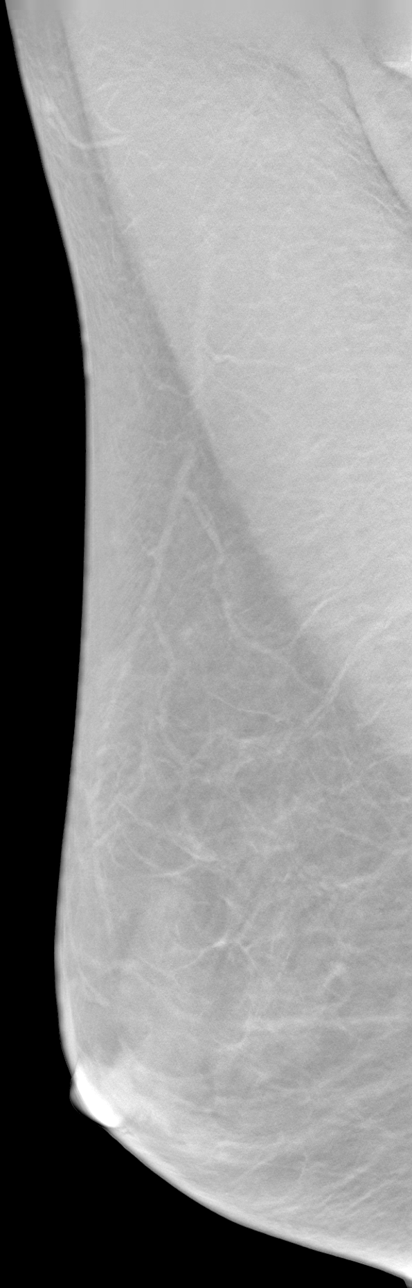
[im 2/2]
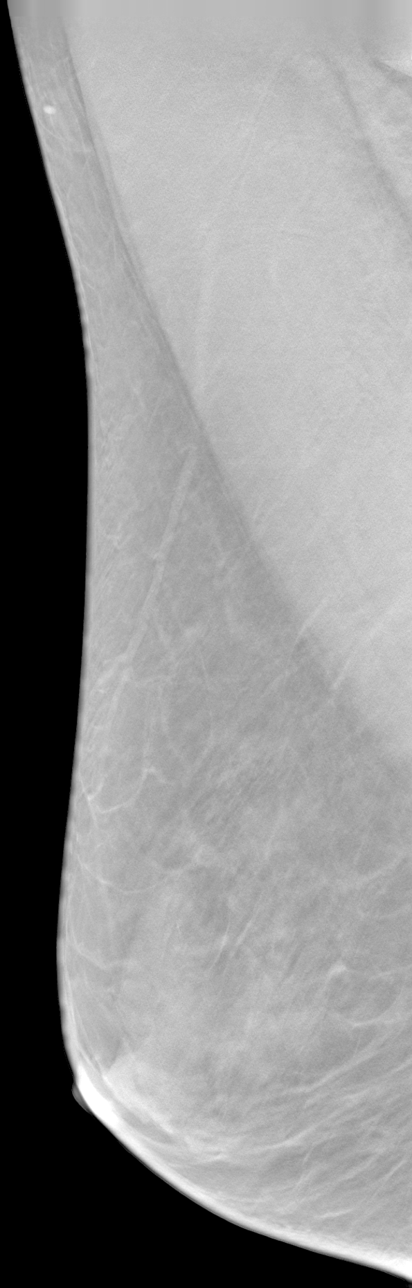

[5 of 11 positions shown; findings below may reference images not displayed]

FINDINGS: There are scattered fibroglandular elements.  There is no mass or suspicious cluster of microcalcifications.   There is no architectural distortion, skin thickening or nipple retraction.
IMPRESSION: 1.  BIRADS 2-Benign findings. Patient has been added in a reminder system with a target date for the next screening mammography.

2.  DENSITY CODE – B (Scattered areas of fibroglandular density). 

Final Assessment Code:

Bi-Rads 2 

BI-RADS 0
 Need additional imaging evaluation.

BI-RADS 1
 Negative mammogram.

BI-RADS 2
 Benign finding.

BI-RADS 3
 Probably benign finding; short-interval follow-up suggested.

BI-RADS 4
 Suspicious abnormality; biopsy should be considered.

BI-RADS 5
 Highly suggestive of malignancy; appropriate action should be taken.

BI-RADS 6
 Known biopsy-proven malignancy; appropriate action should be taken.

NOTE:
In compliance with Federal regulations, the results of this mammogram are being sent to the patient.

------------- REPORT [id number] -------------
[facility]
[address]
We wish to report the following on your recent mammography examination. We are sending a report to your referring physician or other health care provider. 
(       Normal/Negative:
No evidence of cancer.
This statement is mandated by the [place], Department of Health.
Your examination was performed by one of our technologists, who are registered radiological technologists and also specially certified in mammography:
___
Silomea, Viliame R (M)

Your mammogram was interpreted by our radiologist.

( 
Kyuhong Jeang, M.D.

(Annual Breast Examination by a physician or other health care provider
(Annual Mammography Screening beginning at age 40
(Monthly Breast Self Examination

## 2021-10-30 ENCOUNTER — Encounter (INDEPENDENT_AMBULATORY_CARE_PROVIDER_SITE_OTHER): Payer: Self-pay | Admitting: INTERVENTIONAL CARDIOLOGY

## 2021-11-07 ENCOUNTER — Encounter (INDEPENDENT_AMBULATORY_CARE_PROVIDER_SITE_OTHER): Payer: Self-pay | Admitting: INTERVENTIONAL CARDIOLOGY

## 2021-11-20 ENCOUNTER — Other Ambulatory Visit: Payer: Self-pay

## 2021-11-20 ENCOUNTER — Encounter (INDEPENDENT_AMBULATORY_CARE_PROVIDER_SITE_OTHER): Payer: Self-pay | Admitting: NURSE PRACTITIONER

## 2021-11-20 ENCOUNTER — Ambulatory Visit (INDEPENDENT_AMBULATORY_CARE_PROVIDER_SITE_OTHER): Payer: Medicare Other | Admitting: NURSE PRACTITIONER

## 2021-11-20 VITALS — BP 151/84 | HR 72 | Ht 62.0 in | Wt 103.2 lb

## 2021-11-20 DIAGNOSIS — R002 Palpitations: Secondary | ICD-10-CM

## 2021-11-20 DIAGNOSIS — E785 Hyperlipidemia, unspecified: Secondary | ICD-10-CM

## 2021-11-20 DIAGNOSIS — I10 Essential (primary) hypertension: Secondary | ICD-10-CM

## 2021-11-20 NOTE — Progress Notes (Signed)
[facility]iology Clinic [facility]    Name: Erika Wallace  Age: 71 y.o.  Date of Service: 11/20/2021    Primary Care Provider: No Pcp  Chief Complaint:   Chief Complaint   Patient presents with   . Follow Up     Annual   . Hypertension   . Hyperlipidemia       Subjective:  The patient has no history of coronary artery disease. She has not had any prior cardiac testing. She has a history of hyperlipidemia.    10-31-20 The patient is here for yearly f/u. She denies any chest pains or shortness of breath. She does have palpitations, but these are well controlled. She has been having issues with fluctuating blood pressure. Ultimately her primary care changed her to lisinopril. She did bring in a log of her blood pressures daily for the last several months. Overall her numbers I think look excellent. She is just taking the lisinopril as needed when her blood pressure is elevated which is only a few times a month. She also was recently diagnosed with Parkinson's. Labs in March 2022 showed BUN 24, creatinine 0.95, sodium 141, potassium 4.6.    11/20/21 The patient is here for yearly f/u.  She reports she is doing well.  She denies any chest pains or shortness of breath.  Denies any palpitations.  She still just takes lisinopril as needed for blood pressure.  It was elevated today, but she reports at home it is usually in the 130s over the 60s to 70s.  She had lab work in November of 2022 that showed total cholesterol 216, triglycerides 79, HDL 115, LDL 133.        Past Medical History:  Past Medical History:   Diagnosis Date   . Dyslipidemia    . Essential hypertension    . Palpitations          Social History:  Social History     Tobacco Use   Smoking Status Never   Smokeless Tobacco Never      Social History     Substance and Sexual Activity   Alcohol Use Yes   . Alcohol/week: 1.0 standard drink   . Types: 1 Glasses of wine per week    Comment: nightly      Social History     Substance and Sexual Activity    Drug Use Never      Current Medications:  Current Outpatient Medications   Medication Sig   . ascorbic acid (VITAMIN C) 1,000 mg Oral Tablet Take 1 Tablet (1,000 mg total) by mouth Once a day   . aspirin (ECOTRIN) 81 mg Oral Tablet, Delayed Release (E.C.) Take 1 Tablet (81 mg total) by mouth Once a day   . azelastine (ASTELIN) 137 mcg (0.1 %) Nasal Aerosol, Spray Administer 1 Spray into affected nostril(s) Once a day (Patient not taking: Reported on 11/20/2021)   . biotin-keratin 10,000-100 mcg-mg Oral Tablet Take 1 Tablet by mouth Once a day   . carbidopa-levodopa (SINEMET CR) 25-100 mg Oral Tablet Sustained Release Take 1 Tablet by mouth Three times a day   . cholecalciferol, Vitamin D3, (VITAMIN D-3) 125 mcg (5,000 unit) Oral Tablet Take 1 Tablet (5,000 Units total) by mouth Once a day   . ferrous sulfate (FERATAB) 324 mg (65 mg iron) Oral Tablet, Delayed Release (E.C.) Take 1 Tablet (324 mg total) by mouth Every other day   . lisinopriL (PRINIVIL) 2.5 mg Oral Tablet Take 1 Tablet (2.5  mg total) by mouth Once per day as needed (Patient not taking: Reported on 11/20/2021)   . meloxicam (MOBIC) 15 mg Oral Tablet Take 1 Tablet (15 mg total) by mouth Once per day as needed for Pain   . montelukast (SINGULAIR) 10 mg Oral Tablet Take 1 Tablet (10 mg total) by mouth Once a day   . pyridoxine, vitamin B6, (VITAMIN B6) 50 mg Oral Tablet Take 1 Tablet (50 mg total) by mouth Once a day   . selegiline (ELDEPRYL) 5 mg Oral Tablet Take 0.5 Tablets (2.5 mg total) by mouth Twice daily   . vitamin E 200 unit Oral Capsule Take 1 Capsule (200 Units total) by mouth Once a day     Allergies:  No Known Allergies   Review of Systems:  Complete ROS was performed and otherwise negative unless noted in HPI.      Vital Signs:  Vitals:    11/20/21 1051   BP: (!) 151/84   Pulse: 72   SpO2: 99%   Weight: 46.8 kg (103 lb 4 oz)   Height: 1.575 m (5\' 2" )   BMI: 18.92      Physical Exam:  General: Pt resting comfortably in no acute distress and  appears stated age.    Neck: No JVD, no carotid bruit. Neck supple, symmetrical, trachea midline.   Lungs:  Normal respiratory effort, lungs clear to auscultation bilaterally.    Cardiovascular:  Regular rate and rhythm.  Normal S1 and S2 without murmur, gallop, or rub.  Abdomen: Soft, non-tender and bowel sounds normal.    Extremities: Extremities normal, atraumatic, no cyanosis or edema.    Neurologic: Alert and oriented x3.       Assessment:    Palpitations    Hypertension, unspecified type    Hyperlipidemia, unspecified hyperlipidemia type      Plan:   Patient is stable from cardiac standpoint.  Continue to monitor blood pressure at home.  Her LDL is still elevated, but HDL is also high at 115 so her ratio is good.  Continue follow-up with PCP.  Return in 1 year for routine follow-up.    Orders placed this visit:  Orders Placed This Encounter   . EKG (In-Clinic Today)       BREDA BOND is to return to clinic for follow up with the understanding that should symptoms change or worsen she is to call the office or go to the closest emergency department for evaluation.    Bernette Redbird, APRN,FNP-BC    A portion of this documentation may have been generated using MMODAL voice recognition software and may contain syntax/voice recognition errors.

## 2021-11-21 ENCOUNTER — Encounter (INDEPENDENT_AMBULATORY_CARE_PROVIDER_SITE_OTHER): Payer: Medicare Other | Admitting: INTERVENTIONAL CARDIOLOGY

## 2021-12-13 IMAGING — MG 3D SCREENING MAMMO BIL AND TOMO
5 series · 9 of 22 positions shown · non-contrast
Comparison: 02/09/2022

------------- REPORT [id number] -------------
﻿

TELLU ZHR,PA
EXAM:  3D BILATERAL ANNUAL SCREENING DIGITAL MAMMOGRAM WITH TOMOSYNTHESIS
INDICATION: Screening.

[L]
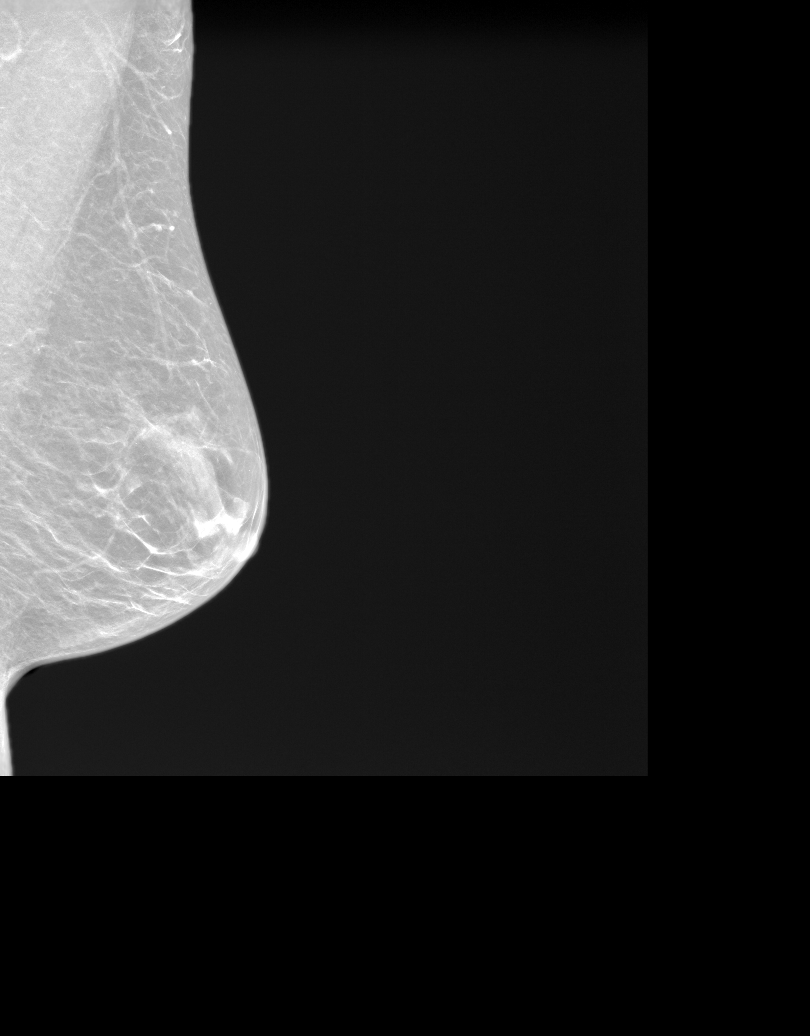

[R]
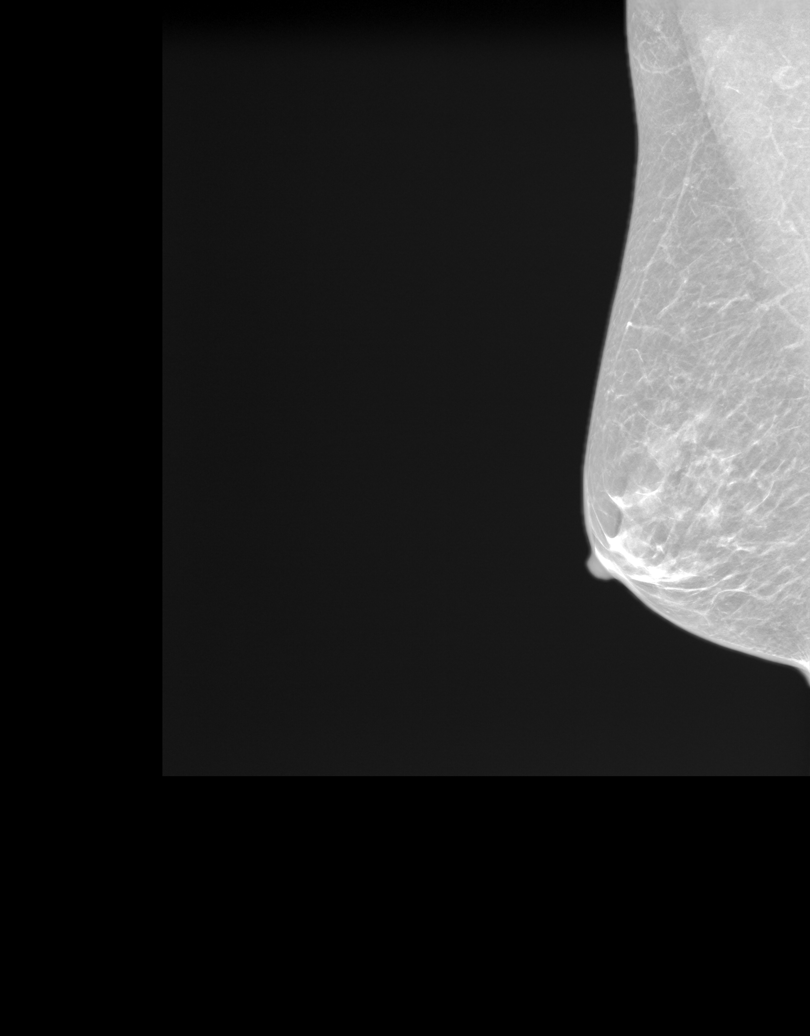

[R CC tomo · right · 0.10mm/px · 2 of 2 slices shown]
[im 1/2]
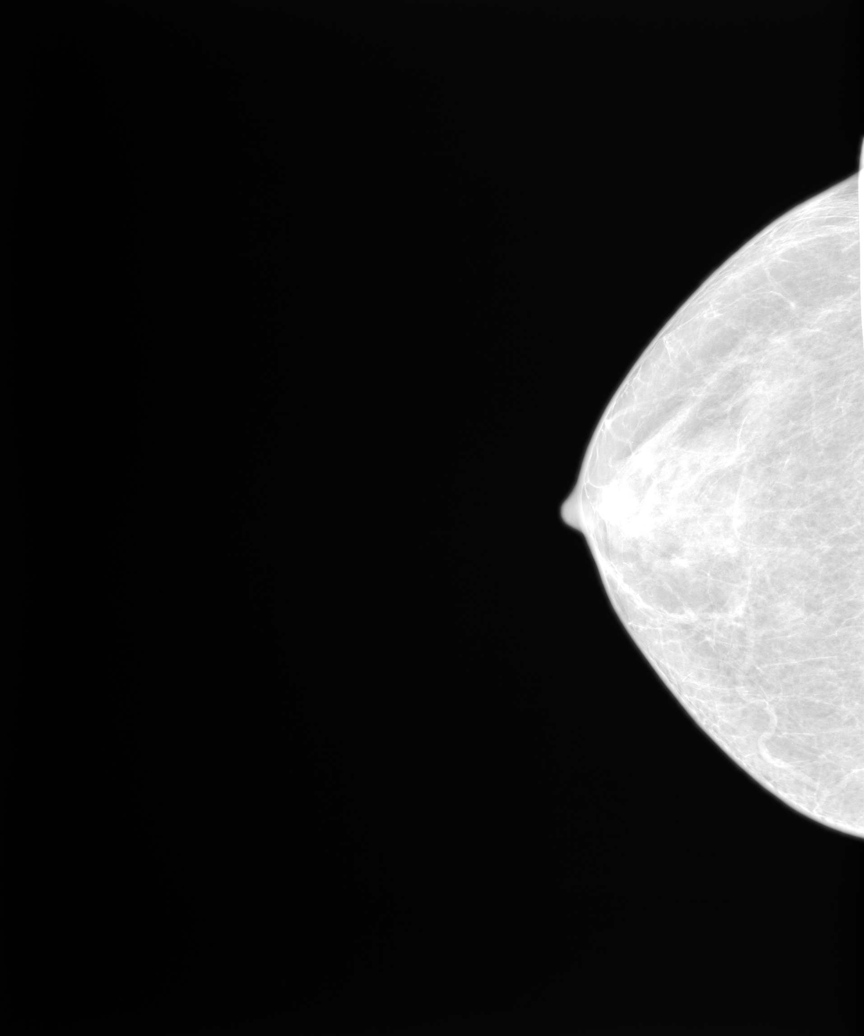
[im 2/2]
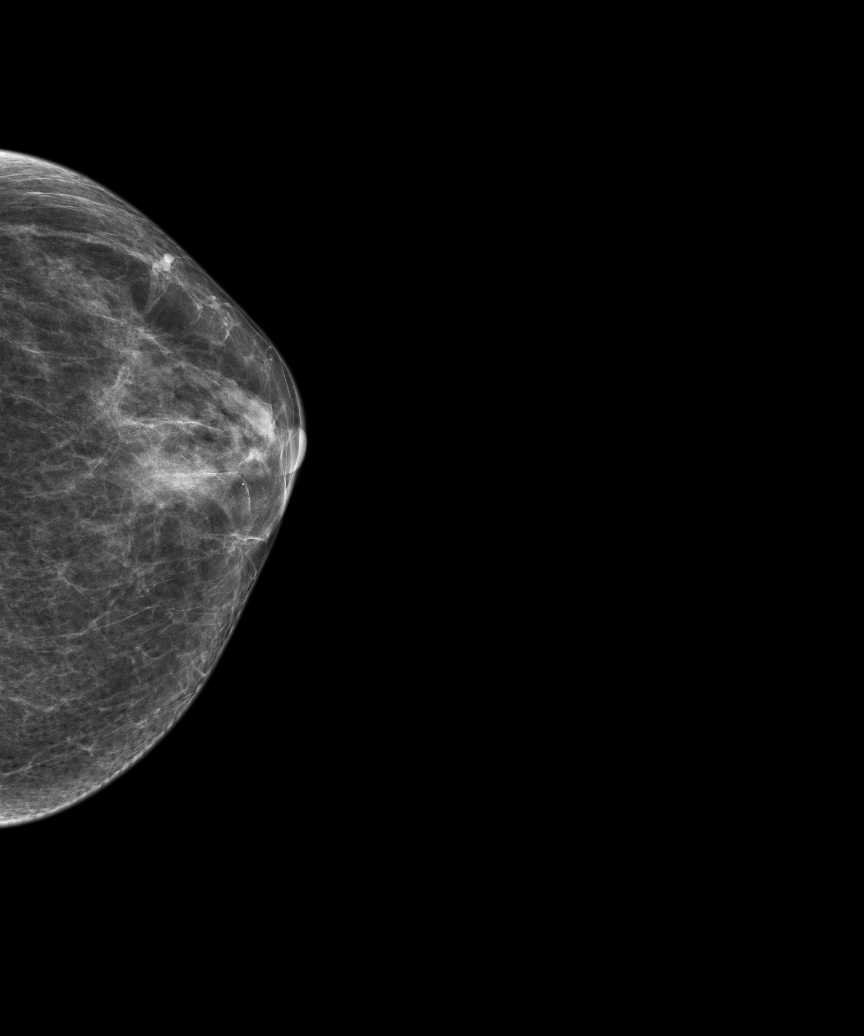

[3D SCREENING MAMMO BIL AND TOMO tomo · 2 acquisitions, 4 frames shown (1 of 2)]
[im 1/2]
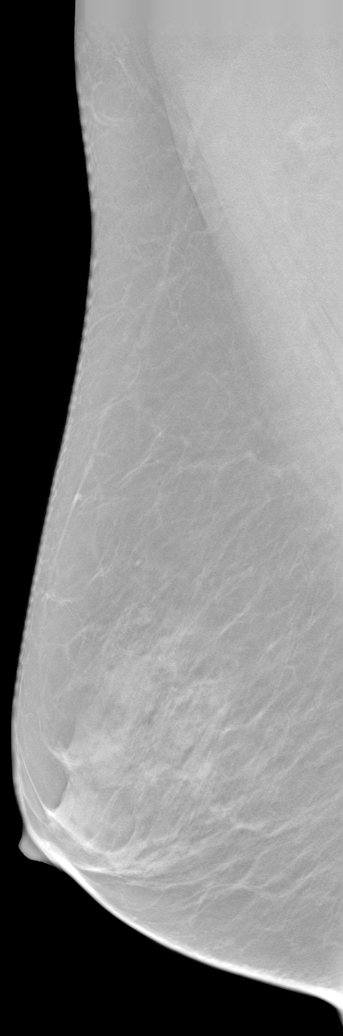
[im 1/2]
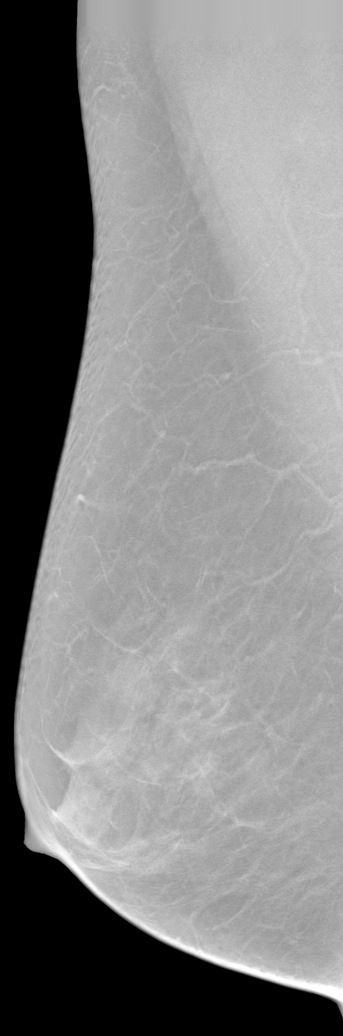
[im 2/2]
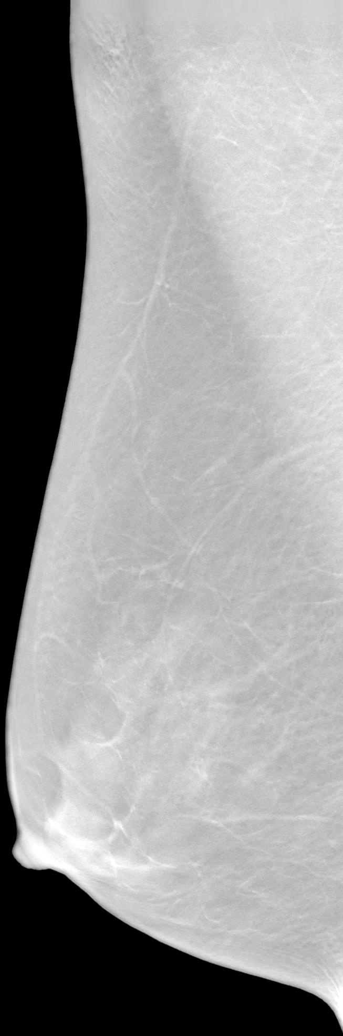
[im 2/2]
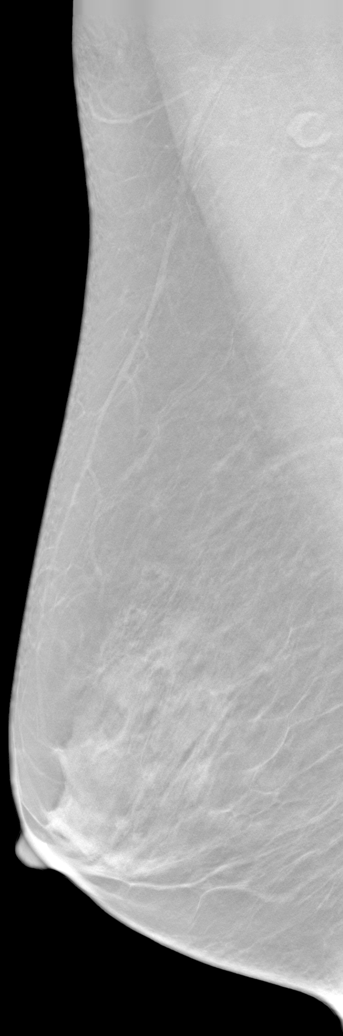

[3D SCREENING MAMMO BIL AND TOMO tomo (2 of 2) · tomo slice 5/31.0]
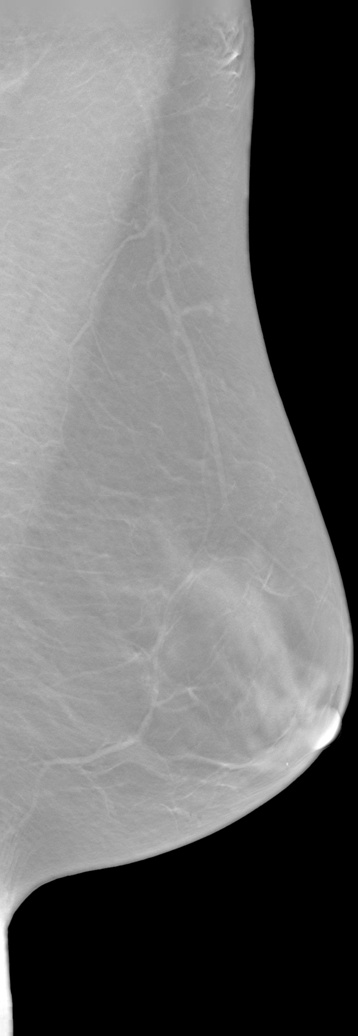

[9 of 22 positions shown; findings below may reference images not displayed]

FINDINGS: There are scattered fibroglandular elements.  There is no mass or suspicious cluster of microcalcifications.   There is no architectural distortion, skin thickening or nipple retraction.
IMPRESSION: 1.  BIRADS 1-Negative. Patient has been added in a reminder system with a target date for the next screening mammography.

2.  DENSITY CODE – B (Scattered areas of fibroglandular density)  

Final Assessment Code:

Bi-Rads 1

BI-RADS 0
 Need additional imaging evaluation.

BI-RADS 1
 Negative mammogram.

BI-RADS 2
 Benign finding.

BI-RADS 3
 Probably benign finding; short-interval follow-up suggested.

BI-RADS 4
 Suspicious abnormality; biopsy should be considered.

BI-RADS 5
 Highly suggestive of malignancy; appropriate action should be taken.

BI-RADS 6
 Known biopsy-proven malignancy; appropriate action should be taken.

NOTE:
In compliance with Federal regulations, the results of this mammogram are being sent to the patient.

------------- REPORT [id number] -------------
[facility] [place]
[address]
We wish to report the following on your recent mammography examination. We are sending a report to your referring physician or other health care provider. 
(       Normal/Negative:
No evidence of cancer.
This statement is mandated by the [place], Department of Health.
Your examination was performed by one of our technologists, who are registered radiological technologists and also specially certified in mammography:
___
Silomea, Viliame R (M)

Your mammogram was interpreted by our radiologist.

( 
Kyuhong Jeang, M.D.

(Annual Breast Examination by a physician or other health care provider
(Annual Mammography Screening beginning at age 40
(Monthly Breast Self Examination

## 2022-04-07 LAB — ECG W INTERP (AMB USE ONLY)(MUSE,IN CLINIC)
Atrial Rate: 71 {beats}/min
Calculated P Axis: 85 degrees
Calculated R Axis: 91 degrees
Calculated T Axis: 92 degrees
PR Interval: 148 ms
QRS Duration: 74 ms
QT Interval: 378 ms
QTC Calculation: 410 ms
Ventricular rate: 71 {beats}/min

## 2022-04-30 ENCOUNTER — Ambulatory Visit (INDEPENDENT_AMBULATORY_CARE_PROVIDER_SITE_OTHER): Payer: Self-pay | Admitting: Surgery

## 2022-05-14 ENCOUNTER — Encounter (INDEPENDENT_AMBULATORY_CARE_PROVIDER_SITE_OTHER): Payer: Self-pay | Admitting: Surgery

## 2022-05-14 ENCOUNTER — Ambulatory Visit (INDEPENDENT_AMBULATORY_CARE_PROVIDER_SITE_OTHER): Payer: Medicare Other | Admitting: Surgery

## 2022-05-14 ENCOUNTER — Other Ambulatory Visit: Payer: Self-pay

## 2022-05-14 VITALS — BP 140/84 | HR 94 | Temp 98.6°F | Resp 18 | Ht 62.0 in | Wt 105.0 lb

## 2022-05-14 DIAGNOSIS — Z800 Family history of malignant neoplasm of digestive organs: Secondary | ICD-10-CM

## 2022-05-14 DIAGNOSIS — Z1211 Encounter for screening for malignant neoplasm of colon: Secondary | ICD-10-CM

## 2022-05-14 MED ORDER — SUTAB 1.479-0.188-0.225 GRAM TABLET
ORAL_TABLET | ORAL | 0 refills | Status: DC
Start: 2022-05-14 — End: 2022-09-27

## 2022-05-14 NOTE — Progress Notes (Signed)
GENERAL SURGERY, [facility] GENERAL SURGERY  [address]  [place] [address]    History and Physical     Name: Erika Wallace MRN:  [id number]   Date: 05/14/2022 Age: 72 y.o.            Reason for Visit: Colonoscopy    History of Present Illness  Erika Wallace presents today for high risk screening colonoscopy due to family history of colon cancer    Negative DM, blood thinner  Positive for Parkinson's followed by Dr. Bosie Helper      Review of the result(s) of each unique test:  Patient underwent diagnostic testing ( none ) prior to this dates visit.  I have personally reviewed the results and that serves as a component of the medical decision making for this encounter       Review of prior external note(s) from each unique source:  Patients referral to this office including a recent assessment by the referring provider.  This was reviewed by me for this unique office visit for the indication and intent of the referral as well as any pertinent medical or surgical history relevant to the patients independent evaluation by me today.      Patient History  Past Medical History:   Diagnosis Date    Dyslipidemia     Essential hypertension     Palpitations          Past Surgical History:   Procedure Laterality Date    HX APPENDECTOMY      HX CHOLECYSTECTOMY      HX EYELID SURGERY  2020    HX TUBAL LIGATION           Current Outpatient Medications   Medication Sig    ascorbic acid (VITAMIN C) 1,000 mg Oral Tablet Take 1 Tablet (1,000 mg total) by mouth Once a day    aspirin (ECOTRIN) 81 mg Oral Tablet, Delayed Release (E.C.) Take 1 Tablet (81 mg total) by mouth Once a day    azelastine (ASTELIN) 137 mcg (0.1 %) Nasal Aerosol, Spray Administer 1 Spray into affected nostril(s) Once a day    biotin-keratin 10,000-100 mcg-mg Oral Tablet Take 1 Tablet by mouth Once a day    carbidopa-levodopa (SINEMET CR) 25-100 mg Oral Tablet Sustained Release Take 1 Tablet by mouth Three times a day    cholecalciferol, Vitamin  D3, (VITAMIN D-3) 125 mcg (5,000 unit) Oral Tablet Take 1 Tablet (5,000 Units total) by mouth Once a day    ferrous sulfate (FERATAB) 324 mg (65 mg iron) Oral Tablet, Delayed Release (E.C.) Take 1 Tablet (324 mg total) by mouth Every other day    lisinopriL (PRINIVIL) 2.5 mg Oral Tablet Take 1 Tablet (2.5 mg total) by mouth Once per day as needed    meloxicam (MOBIC) 15 mg Oral Tablet Take 1 Tablet (15 mg total) by mouth Once per day as needed for Pain    montelukast (SINGULAIR) 10 mg Oral Tablet Take 1 Tablet (10 mg total) by mouth Once a day    pyridoxine, vitamin B6, (VITAMIN B6) 50 mg Oral Tablet Take 1 Tablet (50 mg total) by mouth Once a day    selegiline (ELDEPRYL) 5 mg Oral Tablet Take 0.5 Tablets (2.5 mg total) by mouth Twice daily    sod sulf-pot chloride-mag sulf (SUTAB) 1.479-0.188- 0.225 gram Oral Tablet Take 12 pills at 10:00am to 10:30am Take 12 pills at 6:00pm to 6:30pm. Drink plenty of water    vitamin E 200  unit Oral Capsule Take 1 Capsule (200 Units total) by mouth Once a day     No Known Allergies  Family Medical History:       Problem Relation (Age of Onset)    COPD Father    Colon Cancer Mother            Social History     Tobacco Use    Smoking status: Never    Smokeless tobacco: Never   Vaping Use    Vaping Use: Never used   Substance Use Topics    Alcohol use: Yes     Alcohol/week: 1.0 standard drink of alcohol     Types: 1 Glasses of wine per week     Comment: nightly    Drug use: Never            Physical Examination:  Vitals:    05/14/22 0959   BP: (!) 140/84   Pulse: 94   Resp: 18   Temp: 37 C (98.6 F)   SpO2: 93%   Weight: 47.6 kg (105 lb)   Height: 1.575 m (5\' 2" )   BMI: 19.24        General: appropriate for age. in no acute distress.    Vital signs are present above and have been reviewed by me     HEENT: Atraumatic, Normocephalic. PERRLA. EOMI. Nose clear. Throat clear    Lungs: Nonlabored breathing with symmetric expansion. Clear to auscultation bilaterally    Heart:Regular wth  respect to rate and rythmn.    Abdomen:Soft. Nontender. Nondistended and benign    Extremities: Grossly normal. No major deformities     Neuro:  Grossly normal motor and sensory function    Psychiatric: Alert and oriented to person, place, and time. affect appropriate      Assessment and Plan  High risk screening colonoscopy scheduled for 09/27/22 @ 700am      Follow Up:  No follow-ups on file.      ICD-10-CM    1. Family history of colon cancer requiring screening colonoscopy  Z80.0       2. Encounter for screening colonoscopy  Z12.11           Oneil Behney B Timathy Newberry, MD ,MBA,FACS    I appreciate the opportunity to be involved in the care of your patients.  If you have any questions or concerns regarding this encounter, please do not hesitate to contact me at your convenience.      This note may have been partially generated using MModal Fluency Direct system, and there may be some incorrect words, spellings, and punctuation that were not noted in checking the note before saving, though effort was made to avoid such errors.

## 2022-09-27 ENCOUNTER — Inpatient Hospital Stay
Admission: RE | Admit: 2022-09-27 | Discharge: 2022-09-27 | Disposition: A | Discharge status: Home / Self Care | Payer: Medicare Other | Source: Ambulatory Visit | Attending: Surgery | Admitting: Surgery

## 2022-09-27 ENCOUNTER — Other Ambulatory Visit: Payer: Self-pay

## 2022-09-27 ENCOUNTER — Ambulatory Visit (HOSPITAL_COMMUNITY): Payer: Medicare Other | Admitting: Certified Registered"

## 2022-09-27 ENCOUNTER — Encounter (HOSPITAL_COMMUNITY): Payer: Self-pay | Admitting: Surgery

## 2022-09-27 ENCOUNTER — Encounter (HOSPITAL_COMMUNITY)
Admission: RE | Disposition: A | Discharge status: Home / Self Care | Payer: Self-pay | Source: Ambulatory Visit | Attending: Surgery

## 2022-09-27 ENCOUNTER — Encounter (HOSPITAL_COMMUNITY): Payer: Medicare Other | Admitting: Surgery

## 2022-09-27 DIAGNOSIS — Z1211 Encounter for screening for malignant neoplasm of colon: Secondary | ICD-10-CM | POA: Insufficient documentation

## 2022-09-27 DIAGNOSIS — K641 Second degree hemorrhoids: Secondary | ICD-10-CM

## 2022-09-27 DIAGNOSIS — I10 Essential (primary) hypertension: Secondary | ICD-10-CM | POA: Insufficient documentation

## 2022-09-27 DIAGNOSIS — G20A1 Parkinson's disease without dyskinesia, without mention of fluctuations: Secondary | ICD-10-CM | POA: Insufficient documentation

## 2022-09-27 DIAGNOSIS — K6389 Other specified diseases of intestine: Secondary | ICD-10-CM

## 2022-09-27 DIAGNOSIS — Z800 Family history of malignant neoplasm of digestive organs: Secondary | ICD-10-CM

## 2022-09-27 DIAGNOSIS — K5730 Diverticulosis of large intestine without perforation or abscess without bleeding: Secondary | ICD-10-CM | POA: Insufficient documentation

## 2022-09-27 DIAGNOSIS — E785 Hyperlipidemia, unspecified: Secondary | ICD-10-CM | POA: Insufficient documentation

## 2022-09-27 DIAGNOSIS — R002 Palpitations: Secondary | ICD-10-CM | POA: Insufficient documentation

## 2022-09-27 SURGERY — COLONOSCOPY
Anesthesia: General | Wound class: Clean Contaminated Wounds-The respiratory, GI, Genital, or urinary

## 2022-09-27 MED ORDER — LACTATED RINGERS INTRAVENOUS SOLUTION
INTRAVENOUS | Status: DC | PRN
Start: 2022-09-27 — End: 2022-09-27

## 2022-09-27 MED ORDER — PROPOFOL 10 MG/ML INTRAVENOUS EMULSION
Freq: Once | INTRAVENOUS | Status: DC | PRN
Start: 2022-09-27 — End: 2022-09-27
  Administered 2022-09-27: 15 mL via INTRAVENOUS

## 2022-09-27 SURGICAL SUPPLY — 1 items: DETERGENT INSTR 22OZ TRNSPT GEL RINSE FREE NEUT PH PREKLENZ CLR PLSNT LF (MISCELLANEOUS PT CARE ITEMS) ×1 IMPLANT

## 2022-09-27 NOTE — H&P (Signed)
[facility]  General Surgery  History and Physical    Date of Service:  09/27/2022  Erika Wallace, 72 y.o. female  Date of Admission:  09/27/2022  Date of Birth:  1950-08-01  PCP: Ileene Musa, PA-C    Reason for admission:  Colonoscopy    HPI:  Erika Wallace is a 72 y.o. White female who is admitted for HIGH RISK SCREENING     Erika Wallace presents today for high risk screening colonoscopy due to family history of colon cancer     Negative DM, blood thinner  Positive for Parkinson's followed by Dr. Aretha Parrot        Review of the result(s) of each unique test:  Patient underwent diagnostic testing ( none ) prior to this dates visit.  I have personally reviewed the results and that serves as a component of the medical decision making for this encounter        Review of prior external note(s) from each unique source:  Patients referral to this office including a recent assessment by the referring provider.  This was reviewed by me for this unique office visit for the indication and intent of the referral as well as any pertinent medical or surgical history relevant to the patients independent evaluation by me today.    Past Medical History:   Diagnosis Date    Dyslipidemia     Essential hypertension     Palpitations       Past Surgical History:   Procedure Laterality Date    HX APPENDECTOMY      HX CHOLECYSTECTOMY      HX EYELID SURGERY  2020    HX TUBAL LIGATION        Social History     Tobacco Use    Smoking status: Never    Smokeless tobacco: Never   Vaping Use    Vaping status: Never Used   Substance Use Topics    Alcohol use: Yes     Alcohol/week: 1.0 standard drink of alcohol     Types: 1 Glasses of wine per week     Comment: nightly    Drug use: Never       Family Medical History:       Problem Relation (Age of Onset)    COPD Father    Colon Cancer Mother           Medications Prior to Admission       Prescriptions    ascorbic acid (VITAMIN C) 1,000 mg Oral Tablet    Take 1 Tablet (1,000 mg  total) by mouth Once a day    aspirin (ECOTRIN) 81 mg Oral Tablet, Delayed Release (E.C.)    Take 1 Tablet (81 mg total) by mouth Once a day    azelastine (ASTELIN) 137 mcg (0.1 %) Nasal Aerosol, Spray    Administer 1 Spray into affected nostril(s) Once a day    biotin-keratin 10,000-100 mcg-mg Oral Tablet    Take 1 Tablet by mouth Once a day    carbidopa-levodopa (SINEMET CR) 25-100 mg Oral Tablet Sustained Release    Take 1 Tablet by mouth Three times a day    cholecalciferol, Vitamin D3, (VITAMIN D-3) 125 mcg (5,000 unit) Oral Tablet    Take 1 Tablet (5,000 Units total) by mouth Once a day    ferrous sulfate (FERATAB) 324 mg (65 mg iron) Oral Tablet, Delayed Release (E.C.)    Take 1 Tablet (324 mg total) by mouth Every other  day    lisinopriL (PRINIVIL) 2.5 mg Oral Tablet    Take 1 Tablet (2.5 mg total) by mouth Once per day as needed    meloxicam (MOBIC) 15 mg Oral Tablet    Take 1 Tablet (15 mg total) by mouth Once per day as needed for Pain    montelukast (SINGULAIR) 10 mg Oral Tablet    Take 1 Tablet (10 mg total) by mouth Once a day    pyridoxine, vitamin B6, (VITAMIN B6) 50 mg Oral Tablet    Take 1 Tablet (50 mg total) by mouth Once a day    selegiline (ELDEPRYL) 5 mg Oral Tablet    Take 0.5 Tablets (2.5 mg total) by mouth Twice daily    vitamin E 200 unit Oral Capsule    Take 1 Capsule (200 Units total) by mouth Once a day           No Known Allergies       Patient Vitals for the past 24 hrs:   BP Temp Pulse Resp SpO2 Height Weight   09/27/22 0634 (!) 146/86 36.4 C (97.6 F) 98 20 99 % 1.575 m (5\' 2" ) 47.6 kg (105 lb)          General: appropriate for age. in no acute distress.    Vital signs are present above and have been reviewed by me     HEENT: Atraumatic, Normocephalic. PERRLA, EOMI. Nose clear. Throat clear.    Lungs: Nonlabored breathing with symmetric expansion.  Clear to auscultation bilaterally    Heart:Regular wth respect to rate and rythmn.    Abdomen:Soft. Nontender. Nondistended and  benign    Extremities:  Grossly normal with good range of motion and no major deformities.    Neuro:  Grossly normal motor and sensory function. CN's II through XII intact.    Psychiatric: Alert and oriented to person, place, and time. affect appropriate    Laboratory Data:     No results found for any visits on 09/27/22 (from the past 24 hour(s)).    Imaging Studies:    No orders to display        Assessment/Plan:  HIGH RISK SCREENING    Colonoscopy scheduled for Friday Sep 27, 2022     Discussed indications, risks, and benefits of colonoscopy with possible biopsy/polypectomy with the patient.  Discussed the possibility of polypectomy, biopsies, and possible repeat examinations.  Risks include bleeding, sedation risks, possibility of missed diagnosis of polyp or malignancy, and remote possibilities of perforation and death.  All questions were answered, and informed consent was clearly obtained.    This note was partially created using voice recognition software and is inherently subject to errors including those of syntax and "sound alike " substitutions which may escape proof reading. In such instances, original meaning may be extrapolated by contextual derivation.    Fidela Juneau, MD, MBA, FACS

## 2022-09-27 NOTE — Anesthesia Postprocedure Evaluation (Signed)
Anesthesia Post Op Evaluation    Patient: Erika Wallace  Procedure(s):  COLONOSCOPY    Last Vitals:Temperature: 36.1 C (97 F) (09/27/22 0731)  Heart Rate: 64 (09/27/22 0731)  BP (Non-Invasive): (!) 101/58 (09/27/22 0731)  Respiratory Rate: 16 (09/27/22 0731)  SpO2: 100 % (09/27/22 0731)    No notable events documented.    Patient is sufficiently recovered from the effects of anesthesia to participate in the evaluation and has returned to their pre-procedure level.  Patient location during evaluation: PACU       Patient participation: complete - patient participated  Level of consciousness: awake and alert and responsive to verbal stimuli    Pain score: 0  Pain management: adequate  Airway patency: patent    Anesthetic complications: no  Cardiovascular status: acceptable  Respiratory status: acceptable  Hydration status: acceptable  Patient post-procedure temperature: Pt Normothermic   PONV Status: Absent

## 2022-09-27 NOTE — Discharge Instructions (Addendum)
SURGICAL DISCHARGE INSTRUCTIONS     Dr. Donnal Debar, Gene B, MD  performed your COLONOSCOPY today at the [facility]    [facility]:  Monday through Friday from 8 a.m. - 4 p.m.: [phone]    For T&D: [id number]  Between 4 p.m. - 8 a.m., weekends and holidays:  Call ER [phone]    PLEASE SEE WRITTEN HANDOUTS AS DISCUSSED BY YOUR NURSE: Charman Blasco    SIGNS AND SYMPTOMS OF A WOUND / INCISION INFECTION   Be sure to watch for the following:  Increase in redness or red streaks near or around the wound or incision.  Increase in pain that is intense or severe and cannot be relieved by the pain medication that your doctor has given you.  Increase in swelling that cannot be relieved by elevation of a body part, or by applying ice, if permitted.  Increase in drainage, or if yellow / green in color and smells bad. This could be on a dressing or a cast.  Increase in fever for longer than 24 hours, or an increase that is higher than 101 degrees Fahrenheit (normal body temperature is 98 degrees Fahrenheit). The incision may feel warm to the touch.    **CALL YOUR DOCTOR IF ONE OR MORE OF THESE SIGNS / SYMPTOMS SHOULD OCCUR.    ANESTHESIA INFORMATION   ANESTHESIA -- ADULT PATIENTS:  You have received intravenous sedation / general anesthesia, and you may feel drowsy and light-headed for several hours. You may even experience some forgetfulness of the procedure. DO NOT DRIVE A MOTOR VEHICLE or perform any activity requiring complete alertness or coordination until you feel fully awake in about 24-48 hours. Do not drink alcoholic beverages for at least 24 hours. Do not stay alone, you must have a responsible adult available to be with you. You may also experience a dry mouth or nausea for 24 hours. This is a normal side effect and will disappear as the effects of the medication wear off.    REMEMBER   If you experience any difficulty breathing, chest pain, bleeding that you feel is  excessive, persistent nausea or vomiting or for any other concerns:  Call your physician Dr.  Donnal Debar, Cherylann Parr, MD   at [phone] . You may also ask to have the general doctor on call paged. They are available to you 24 hours a day.      SPECIAL INSTRUCTIONS / COMMENTS   POST-OP DIAGNOSIS--TETHERING OF SIGMOID COLON, MODERATE SIGMOID DIVERTICULOSIS, GRADE 2 HEMORRHOIDS  REST TODAY--DO NOT DRIVE OR OPERATE ELECTRICAL EQUIPMENT OR SIGN LEGAL DOCUMENTS FOR 24 HOURS  RETURN TO SEE DR GENE DUREMDES AS NEEDED  PATIENT OF FIVE YEAR COLON LIST

## 2022-09-27 NOTE — Anesthesia Preprocedure Evaluation (Addendum)
ANESTHESIA PRE-OP EVALUATION  Planned Procedure: COLONOSCOPY  Review of Systems     anesthesia history negative               Pulmonary  negative pulmonary ROS,    Cardiovascular    Hypertension, palpitations and hyperlipidemia , Exercise Tolerance: > or = 4 METS        GI/Hepatic/Renal   negative GI/hepatic/renal ROS,         Endo/Other   neg endo/other ROS,       Neuro/Psych/MS    Parkinson's     Cancer    negative hematology/oncology ROS,                     Physical Assessment      Airway       Mallampati: IV    TM distance: <3 FB    Neck ROM: full  Mouth Opening: poor.            Dental       Dentition intact             Pulmonary    Breath sounds clear to auscultation  (-) no rhonchi, no decreased breath sounds, no wheezes, no rales and no stridor     Cardiovascular    Rhythm: regular  Rate: Normal  (-) no friction rub, carotid bruit is not present and no murmur     Other findings              Plan  ASA 2     Planned anesthesia type: general     total intravenous anesthesia                          Anesthetic plan and risks discussed with patient  signed consent obtained          Patient's NPO status is appropriate for Anesthesia.

## 2022-09-27 NOTE — OR Surgeon (Signed)
[facility]      Patient Name: Erika Wallace, Boring Number: [id number]  Date of Service: 09/27/2022   Date of Birth: 15-Jan-1951      Pre-Operative Diagnosis: HIGH RISK SCREENING     Post-Operative Diagnosis: TETHERING OF SIGMOID COLON  MODERATE SIGMOID DIVERTICULOSIS  GRADE 2 HEMORRHOIDS    Procedure(s)/Description:  COLONOSCOPY: 54098 (CPT)     Attending Surgeon: Fidela Juneau, MD     Anesthesia:  CRNA: Welton Flakes, CRNA    Anesthesia Type: .General       The patient indicates that they have read and understood the preoperative colonoscopy consent form. The benefits, risks and alternatives to the procedure were discussed. I specifically discussed the risk of bleeding and/or perforation requiring operation.The patient indicates they have no further question and wish to proceed. Informed consent was obtained from the patient and/or medical power of attorney.    The patient was brought into the procedure room and placed on the table in the left lateral decubitus position. After IV sedation was given, full finger digital rectal examination was performed with a circumferential sweep of the distal rectal mucosa. Subsequently, the flexible colonoscope was inserted into the rectum and passed without any difficulty. The colonoscope was then advanced up into the sigmoid colon, descending colon, transverse colon, right colon and cecum without any difficulty. Gross examination of each section of the colon was performed. Cecal intubation was achieved and the appendiceal orifice and ileocecal valve were identified. The operative findings of diverticulosis were noted as described above. The colonoscope was withdrawn carefully examining the mucosa as the scope was being extracted with particular attention paid to the proximal sides of folds, flexures, bends and rectal valves. At approximately 10 cm. from the anal verge, the colonoscope was retroflexed to fully examine the distal rectum. The  colonoscope was removed and a repeat digital rectal examination was performed at the completion of the procedure. The patient tolerated the procedure well. No intraoperative complications were encountered.    EKG, pulse, pulse oximetry and blood pressure were monitored throughout the entire procedure.    There were no unplanned events.    The patient was instructed to contact me if they have any problems with their colon such as bleeding, pain or changes in bowel habits. They understood and agreed to do so.    The patient will not need another screening colonoscopy for 5 years which is according to ASGE guidelines. However, if in the future the patient has any problems with abdominal pain, changes in bowel habits, blood in stool, etc., then they should contact me because they may be a candidate for diagnostic colonoscopy before the 5 year time limit.      Codi Folkerts [facility] [place], MD, MBA, FACS  [facility] -General Surgery

## 2022-11-19 ENCOUNTER — Encounter (INDEPENDENT_AMBULATORY_CARE_PROVIDER_SITE_OTHER): Payer: Self-pay | Admitting: INTERVENTIONAL CARDIOLOGY

## 2022-11-26 ENCOUNTER — Encounter (INDEPENDENT_AMBULATORY_CARE_PROVIDER_SITE_OTHER): Payer: Medicare Other | Admitting: INTERVENTIONAL CARDIOLOGY

## 2022-12-10 ENCOUNTER — Encounter (INDEPENDENT_AMBULATORY_CARE_PROVIDER_SITE_OTHER): Payer: Self-pay | Admitting: INTERVENTIONAL CARDIOLOGY

## 2022-12-10 ENCOUNTER — Other Ambulatory Visit: Payer: Self-pay

## 2022-12-10 ENCOUNTER — Ambulatory Visit (INDEPENDENT_AMBULATORY_CARE_PROVIDER_SITE_OTHER): Payer: Medicare Other | Admitting: INTERVENTIONAL CARDIOLOGY

## 2022-12-10 VITALS — BP 140/75 | HR 87 | Ht 62.0 in | Wt 104.0 lb

## 2022-12-10 DIAGNOSIS — R002 Palpitations: Secondary | ICD-10-CM

## 2022-12-10 DIAGNOSIS — E785 Hyperlipidemia, unspecified: Secondary | ICD-10-CM

## 2022-12-10 DIAGNOSIS — R9431 Abnormal electrocardiogram [ECG] [EKG]: Secondary | ICD-10-CM

## 2022-12-10 DIAGNOSIS — G20A1 Parkinson's disease without dyskinesia, without mention of fluctuations (CMS HCC): Secondary | ICD-10-CM | POA: Insufficient documentation

## 2022-12-10 DIAGNOSIS — I10 Essential (primary) hypertension: Secondary | ICD-10-CM

## 2022-12-10 DIAGNOSIS — G20B1 Parkinson's disease with dyskinesia, without mention of fluctuations: Secondary | ICD-10-CM | POA: Insufficient documentation

## 2022-12-10 NOTE — Progress Notes (Signed)
[facility] Cardiology    Erika Wallace, 72 y.o. female  Date of Service: 12/10/2022  Date of Birth:  Oct 06, 1950  PCP:  Ileene Musa, PA-C    HPI:    The patient has no history of coronary artery disease. She has not had any prior cardiac testing. She has a history of hyperlipidemia.    10-31-20 The patient is here for yearly f/u.  She denies any chest pains or shortness of breath.  She does have palpitations, but these are well controlled.  She has been having issues with fluctuating blood pressure.  Ultimately her primary care changed her to lisinopril.  She did bring in a log of her blood pressures daily for the last several months.  Overall her numbers I think look excellent.  She is just taking the lisinopril as needed when her blood pressure is elevated which is only a few times a month.  She also was recently diagnosed with Parkinson's. Labs in March 2022 showed BUN 24, creatinine 0.95, sodium 141, potassium 4.6.    11/20/21 The patient is here for yearly f/u.  She reports she is doing well.  She denies any chest pains or shortness of breath.  Denies any palpitations.  She still just takes lisinopril as needed for blood pressure.  It was elevated today, but she reports at home it is usually in the 130s over the 60s to 70s.  She had lab work in November of 2022 that showed total cholesterol 216, triglycerides 79, HDL 115, LDL 133.      12/10/22 The patient presents for f/u. She has persistent palpitations that are mild and intermittent.    EKG: Sinus rhythm, LAA, 82 beats/min    Past Medical History:   Diagnosis Date    Dyslipidemia     Essential hypertension     Palpitations        Past Surgical History:   Procedure Laterality Date    HX APPENDECTOMY      HX CHOLECYSTECTOMY      HX EYELID SURGERY  2020    HX TUBAL LIGATION         Current Outpatient Medications   Medication Sig    ascorbic acid (VITAMIN C) 1,000 mg Oral Tablet Take 1 Tablet (1,000 mg total) by mouth Once a day    aspirin (ECOTRIN) 81 mg Oral  Tablet, Delayed Release (E.C.) Take 1 Tablet (81 mg total) by mouth Once a day    azelastine (ASTELIN) 137 mcg (0.1 %) Nasal Aerosol, Spray Administer 1 Spray into affected nostril(s) Once a day    biotin-keratin 10,000-100 mcg-mg Oral Tablet Take 1 Tablet by mouth Once a day    cholecalciferol, Vitamin D3, (VITAMIN D-3) 125 mcg (5,000 unit) Oral Tablet Take 1 Tablet (5,000 Units total) by mouth Once a day    ferrous sulfate (FERATAB) 324 mg (65 mg iron) Oral Tablet, Delayed Release (E.C.) Take 1 Tablet (324 mg total) by mouth Every other day    latanoprost (XALATAN) 0.005 % Ophthalmic Drops     meloxicam (MOBIC) 15 mg Oral Tablet Take 1 Tablet (15 mg total) by mouth Once per day as needed for Pain    montelukast (SINGULAIR) 10 mg Oral Tablet Take 1 Tablet (10 mg total) by mouth Once a day    pyridoxine, vitamin B6, (VITAMIN B6) 50 mg Oral Tablet Take 1 Tablet (50 mg total) by mouth Once a day    selegiline (ELDYPRYL) 5 mg Oral Capsule     vitamin E  200 unit Oral Capsule Take 1 Capsule (200 Units total) by mouth Once a day     ROS: Other than issues noted in HPI, all other systems were negative.     Exam:  Vitals:    12/10/22 1330   BP: (!) 140/75   Pulse: 87   SpO2: 98%   Weight: 47.2 kg (104 lb)   Height: 1.575 m (5\' 2" )   BMI: 19.06       General: No acute distress and appears stated age.    HEENT:Head normocephalic, atraumatic. ENT without erythema or injection, mucouse membranes moist.    Neck: No JVD, no carotid bruit. and supple, symmetrical, trachea midline.   Lungs: Clear to auscultation bilaterally.    Cardiovascular: Regular rate and rhythm, normal S1S2, no murmur, rub, or gallop, no thrill    Abdomen: Soft, non-tender and bowel sounds normal.    Extremities: Extremities normal, atraumatic, no cyanosis or edema.    Skin: Skin warm and dry.    Neurologic: Alert and oriented x3.  Psych: Mood and affect congruent for age and gender     Assessment/Plan:  Palpitations    Hypertension, unspecified  type    Hyperlipidemia, unspecified hyperlipidemia type    Palpitations - consider apple watch to screen for Afib, repeat EKG at next visit    Hypertension - reports BP is better at home, continue to monitor, resume medicine if elevated    HL - dietary modification, patient reluctant to take medicine, will check CT calcium score. If low I would continue current management. If it is high would initiate statin therapy.    Return in 1 year    Elam Dutch, MD 12/10/2022 14:24

## 2022-12-17 ENCOUNTER — Telehealth (INDEPENDENT_AMBULATORY_CARE_PROVIDER_SITE_OTHER): Payer: Self-pay | Admitting: INTERVENTIONAL CARDIOLOGY

## 2022-12-17 NOTE — Telephone Encounter (Signed)
Pt called and scheduled CT Calcium scan on Aug.26,2024 at 1130 at [facility] Pt voiced understanding.

## 2022-12-30 IMAGING — MG 3D SCREENING MAMMO BIL AND TOMO
5 series · 9 of 24 positions shown · non-contrast
Comparison: 06/29/2024

------------- REPORT [id number] -------------
﻿

EXAM:  3D SCREENING MAMMO BIL AND TOMO
INDICATION: Screening mammogram.

[L]
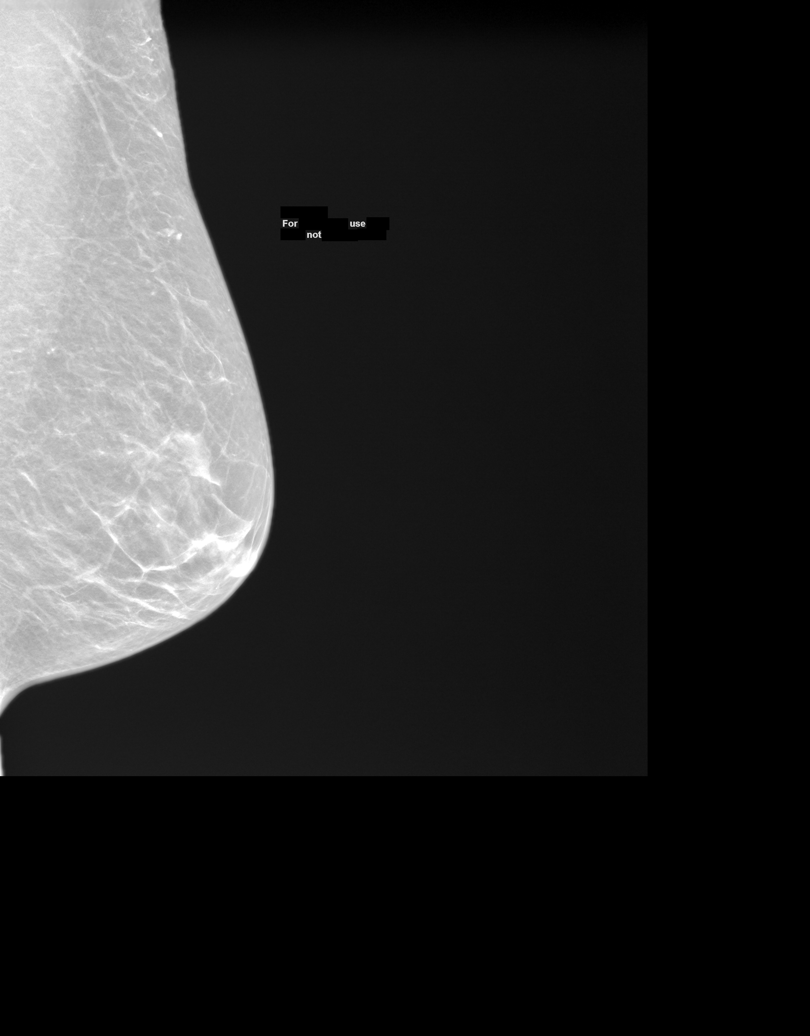

[R]
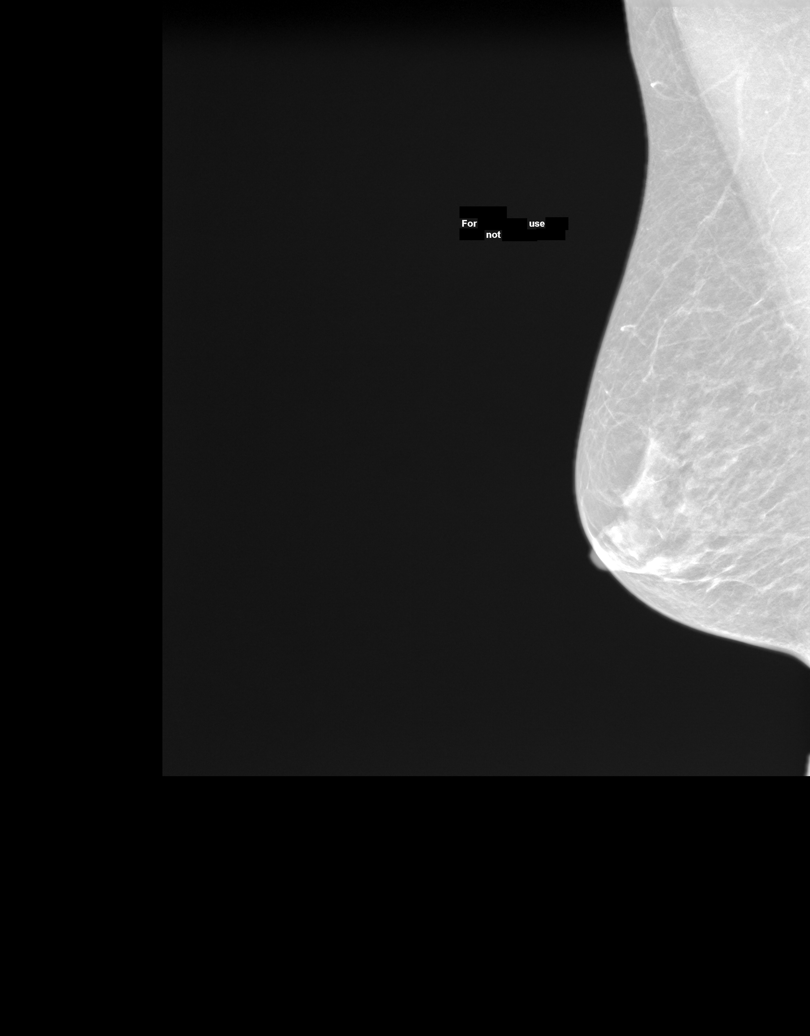

[R CC tomo · right · 0.10mm/px · 2 of 4 slices shown]
[im 1/4]
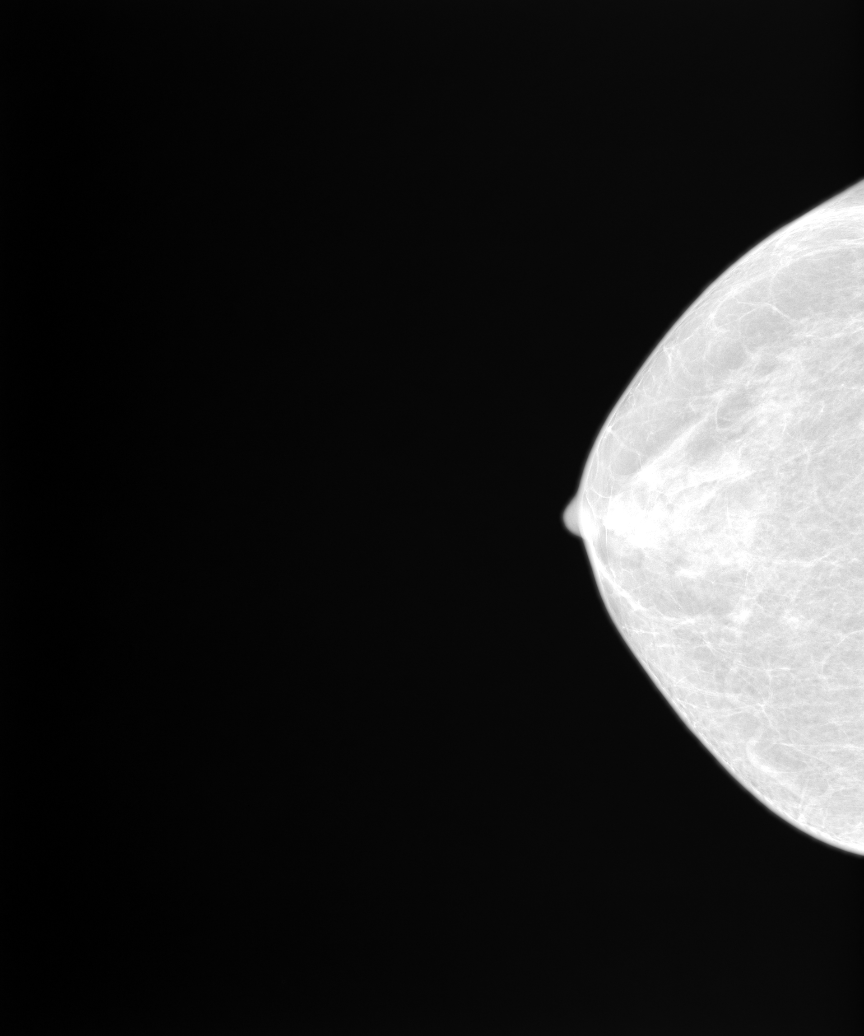
[im 4/4]
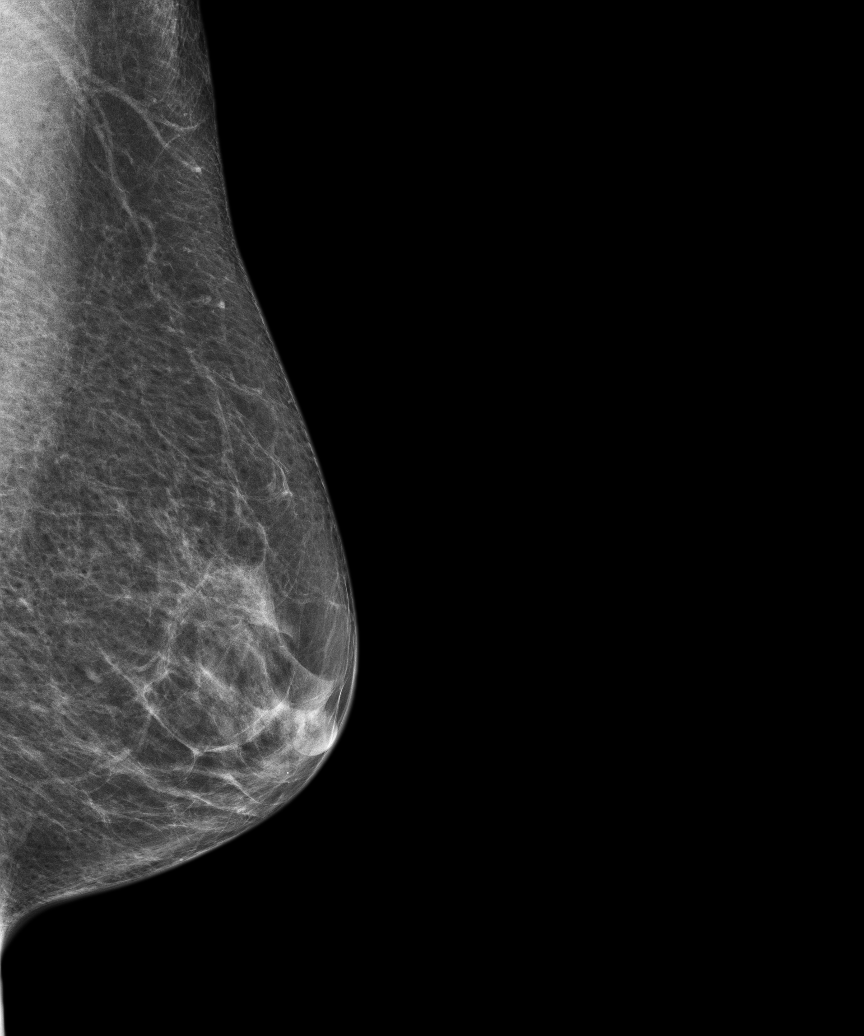

[3D SCREENING MAMMO BIL AND TOMO tomo · 2 acquisitions, 4 frames shown (1 of 2)]
[im 1/2]
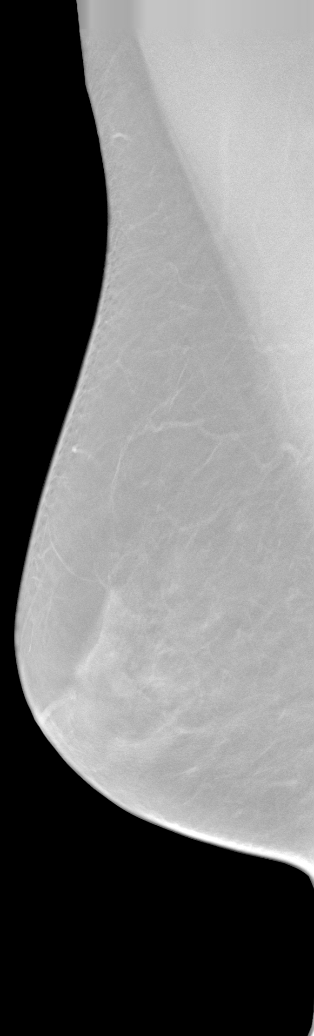
[im 1/2]
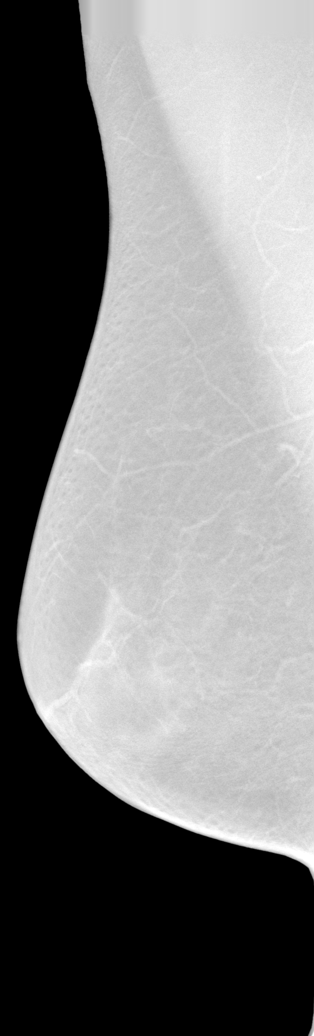
[im 2/2]
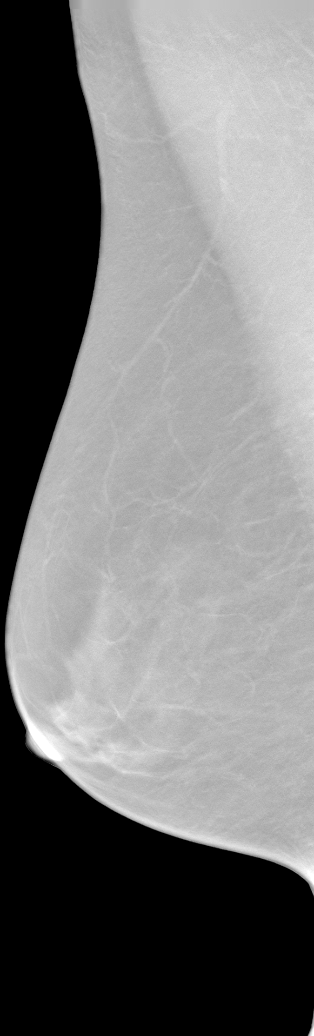
[im 2/2]
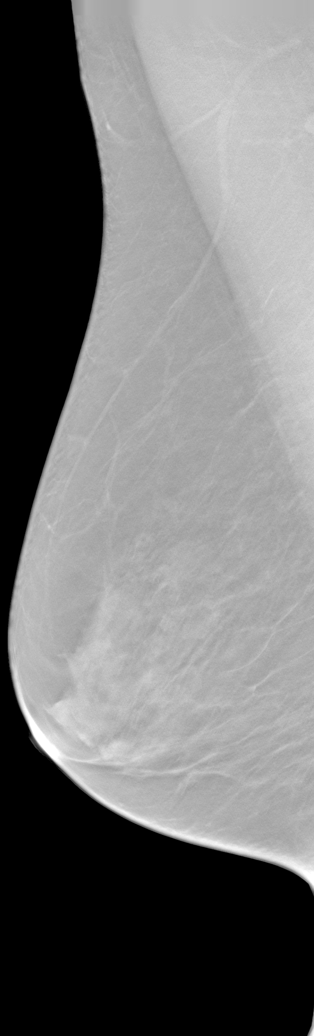

[3D SCREENING MAMMO BIL AND TOMO tomo (2 of 2) · tomo slice 6/34.0]
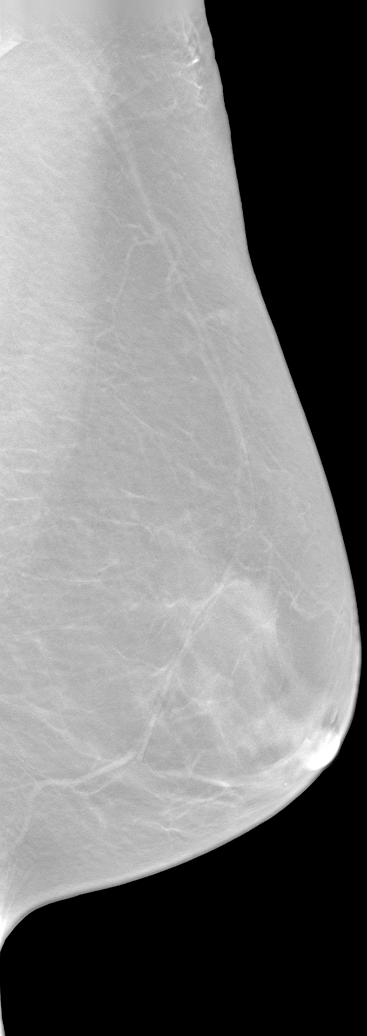

[9 of 24 positions shown; findings below may reference images not displayed]

FINDINGS: There are scattered fibroglandular elements.  There is no mass or suspicious cluster of microcalcifications.   There is no architectural distortion, skin thickening or nipple retraction.
IMPRESSION: 1.  BIRADS 1-Negative. Patient has been added in a reminder system with a target date for the next screening mammography.

2.  DENSITY CODE –  B (Scattered areas of fibroglandular density) 

Final Assessment Code:

BI-RADS 0
 Need additional imaging evaluation.

BI-RADS 1
 Negative mammogram.

BI-RADS 2
 Benign finding.

BI-RADS 3
 Probably benign finding; short-interval follow-up suggested.

BI-RADS 4
 Suspicious abnormality; biopsy should be considered.

BI-RADS 5
 Highly suggestive of malignancy; appropriate action should be taken.

BI-RADS 6
 Known biopsy-proven malignancy; appropriate action should be taken.

NOTE:
In compliance with Federal regulations, the results of this mammogram are being sent to the patient.

------------- REPORT [id number] -------------
[facility]
[address]
We wish to report the following on your recent mammography examination. We are sending a report to your referring physician or other health care provider.
FINDING: Normal-no evidence of cancer

This statement is mandated by the [place], Department of Health.
Your examination was performed by one of our technologists, who are registered radiological technologists and also specially certified in mammography:
___
[place], [place] (M)

Your mammogram was interpreted by our radiologist.

( 
Amore Pho, M.D.

(Annual Breast Examination by a physician or other health care provider
(Annual Mammography Screening beginning at age 40
(Monthly Breast Self Examination

## 2023-01-06 ENCOUNTER — Other Ambulatory Visit: Payer: Self-pay

## 2023-01-06 ENCOUNTER — Inpatient Hospital Stay
Admission: RE | Admit: 2023-01-06 | Discharge: 2023-01-06 | Disposition: A | Discharge status: Home / Self Care | Payer: Medicare Other | Source: Ambulatory Visit | Attending: INTERVENTIONAL CARDIOLOGY | Admitting: INTERVENTIONAL CARDIOLOGY

## 2023-01-06 DIAGNOSIS — R9431 Abnormal electrocardiogram [ECG] [EKG]: Secondary | ICD-10-CM | POA: Insufficient documentation

## 2023-01-06 DIAGNOSIS — R002 Palpitations: Secondary | ICD-10-CM | POA: Insufficient documentation

## 2023-01-06 DIAGNOSIS — I10 Essential (primary) hypertension: Secondary | ICD-10-CM | POA: Insufficient documentation

## 2023-01-06 DIAGNOSIS — E785 Hyperlipidemia, unspecified: Secondary | ICD-10-CM | POA: Insufficient documentation

## 2023-01-10 DIAGNOSIS — I3139 Other pericardial effusion (noninflammatory): Secondary | ICD-10-CM

## 2023-01-10 DIAGNOSIS — I10 Essential (primary) hypertension: Secondary | ICD-10-CM

## 2023-01-10 DIAGNOSIS — R002 Palpitations: Secondary | ICD-10-CM

## 2023-01-10 DIAGNOSIS — E785 Hyperlipidemia, unspecified: Secondary | ICD-10-CM

## 2023-01-10 DIAGNOSIS — R9431 Abnormal electrocardiogram [ECG] [EKG]: Secondary | ICD-10-CM

## 2023-01-14 LAB — ECG W INTERP (AMB USE ONLY)(MUSE,IN CLINIC)
Atrial Rate: 82 {beats}/min
Calculated P Axis: 75 degrees
Calculated R Axis: 87 degrees
Calculated T Axis: 88 degrees
PR Interval: 146 ms
QRS Duration: 74 ms
QT Interval: 360 ms
QTC Calculation: 420 ms
Ventricular rate: 82 {beats}/min

## 2023-09-11 ENCOUNTER — Other Ambulatory Visit: Payer: Self-pay

## 2023-09-11 ENCOUNTER — Ambulatory Visit (INDEPENDENT_AMBULATORY_CARE_PROVIDER_SITE_OTHER): Payer: Self-pay | Admitting: NEUROLOGY

## 2023-09-11 ENCOUNTER — Encounter (INDEPENDENT_AMBULATORY_CARE_PROVIDER_SITE_OTHER): Payer: Self-pay | Admitting: NEUROLOGY

## 2023-09-11 VITALS — BP 155/89 | HR 83 | Temp 98.2°F | Wt 108.0 lb

## 2023-09-11 DIAGNOSIS — G20B1 Parkinson's disease with dyskinesia, without mention of fluctuations: Secondary | ICD-10-CM

## 2023-09-11 MED ORDER — CARBIDOPA ER 25 MG-LEVODOPA 100 MG TABLET,EXTENDED RELEASE
1.0000 | ORAL_TABLET | Freq: Three times a day (TID) | ORAL | 1 refills | Status: DC
Start: 2023-09-11 — End: 2023-12-08

## 2023-09-11 MED ORDER — SELEGILINE 5 MG CAPSULE
5.0000 mg | ORAL_CAPSULE | Freq: Every day | ORAL | 1 refills | Status: DC
Start: 2023-09-11 — End: 2023-12-08

## 2023-09-11 NOTE — Progress Notes (Signed)
 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 She reports stiffness and decreased stamina in her legs but denies falls or tremors. On examination, she demonstrates mild decreased blink rate and dyskinesia (head, left arm, bilateral lower extremities), which is not bothersome to her. The disease appears to be affecting her left side more prominently. She maintains good function, including the ability to walk for 30 minutes daily. No significant cognitive impairment is noted at this time. The patient's symptoms seem to be progressing slowly, which is favorable given her age and time since diagnosis.  Continue Sinemet  (carbidopa /levodopa ) 25/100 mg controlled release, 1 tablet PO TID  Continue selegiline  5 mg, 0.5 tablet PO at 7 AM and 0.5 tablet at noon  Provided education on avoiding high-protein meals with Sinemet  administration  Encouraged to maintain current level of physical activity  Monitor for development of tremors, worsening symptoms, falls, or cognitive changes  Follow up in 6 months or sooner if new symptoms develop    Thank you for allowing me to participate in your patient's care and please do not hesitate to contact me for any questions or concerns.    On the day of the encounter, a total of 60 minutes was spent on this patient encounter including review of historical information, examination, documentation and post-visit activities. The time documented excludes procedural time.    Erika Prophet, MD  Assistant Professor of Neurology  Learned  Erika Wallace    =====================================================================    NAME:  Erika Wallace  DOB:  1950-10-19  VISIT DATE:  09/11/2023     CC:  Parkinson's  disease    Patient seen in consultation at the request of Erika Wallace  History obtained from the patient and chart/records  Age of patient:  73 y.o.    I had the pleasure of seeing Erika Wallace for outpatient consultation, who is a 73 y.o. year old female and is being seen for management of above CC.    HPI:      09/11/2023  73 year old female with a history of Parkinson's disease, diagnosed in 2022, presenting for follow-up. She reports overall good symptom control with her current medication regimen of Sinemet  (carbidopa /levodopa  CR) and selegiline .    [name] notes that her main symptoms are stiffness and lack of stamina, particularly in her legs. She denies experiencing falls or tremors. The patient reports that her symptoms improve within 15-30 minutes of taking Sinemet . She mentions noticing when her medication begins to wear off but does not specify any particular timing for this. [name] has adapted to her condition and continues to stay active, walking for 30 minutes daily either outside or following a path through her house.    The patient reports some sleep disturbances, typically waking between 3 and 4 AM, then returning to sleep until 6:30 or 7 AM. She denies acting out her dreams or thrashing around during sleep. [name] experiences some discomfort in her hips when turning over in bed but clarifies that this is not due to stiffness. She denies any difficulty swallowing or significant drooling.    Medical History  - Parkinson's disease diagnosed in 2022    Medications and Supplements  - Sinemet  25/100 mg by mouth one tablet three times a day  - Selegiline  half tablet at 7 AM  and half at noon  - Vitamin supplements    Family History  - No family history of Parkinson's disease    Social History  - Living Situation: Lives alone, sleeps alone  - Exercise: Walks for 30 minutes daily  - Diet: Takes vitamin supplements, appetite is fine  - Sleep: Sleeps from 11:30 PM to 3-4 AM, then until 6:30-7 AM  - Mood: Mood is  okay, no anxiety or depression  - Social Activity: Patient reports being social    Review of Systems  - General: Positive for fatigue  - HEENT: Negative for drooling, trouble swallowing  - Cardiovascular: Positive for low blood pressure in the mornings  - Musculoskeletal: Positive for hip discomfort when turning in bed  - Neurological: Negative for tremor, positive for stiffness, especially in the morning  - Psychiatric: Negative for anxiety, depression    ----------------------------------------------------  Per Dr. Patsie Wallace last note 04/22/2023:  Her sister drove her here. She has Parkinson disease with predominant left-sided manifestations including shuffling gait, imbalance on walking, chronic fatigue, hyposmia, dysphagia and clinically asymptomatic peripheral neuropathy at baseline. She has done well since last seen 6 months ago. She is on Sinemet  and selegiline . She had no falls, choking or shuffling since last seen. She has history of anxiety which is currently in remission. She reports no signs or symptoms of depression, psychosis or memory impairment. She has history of L3-4 spinal stenosis which is clinically asymptomatic. Time spent completing encounter was 30 minutes.  No follow-up has been scheduled since this practice is closing. Appropriate instructions have been provided in that regard.    =====================================================================  PMHx  Patient Active Problem List   Diagnosis    Parkinson's disease without dyskinesia, without mention of fluctuations     Past Surgical History:   Procedure Laterality Date    HX APPENDECTOMY      HX CHOLECYSTECTOMY      HX EYELID SURGERY  2020    HX TUBAL LIGATION       Family Medical History:       Problem Relation (Age of Onset)    COPD Father    Colon Cancer Mother          Current Outpatient Medications   Medication Sig Dispense Refill    ascorbic acid (VITAMIN C) 1,000 mg Oral Tablet Take 1 Tablet (1,000 mg total) by mouth Once a day       aspirin (ECOTRIN) 81 mg Oral Tablet, Delayed Release (E.C.) Take 1 Tablet (81 mg total) by mouth Once a day      azelastine (ASTELIN) 137 mcg (0.1 %) Nasal Aerosol, Spray Administer 1 Spray into affected nostril(s) Once a day      biotin-keratin 10,000-100 mcg-mg Oral Tablet Take 1 Tablet by mouth Once a day      cholecalciferol, Vitamin D3, (VITAMIN D-3) 125 mcg (5,000 unit) Oral Tablet Take 1 Tablet (5,000 Units total) by mouth Once a day      ferrous sulfate (FERATAB) 324 mg (65 mg iron) Oral Tablet, Delayed Release (E.C.) Take 1 Tablet (324 mg total) by mouth Every other day      latanoprost (XALATAN) 0.005 % Ophthalmic Drops       meloxicam (MOBIC) 15 mg Oral Tablet Take 1 Tablet (15 mg total) by mouth Once per day as needed for Pain      montelukast (SINGULAIR) 10 mg Oral Tablet Take 1 Tablet (10 mg total) by mouth Once a day      pyridoxine, vitamin B6, (  VITAMIN B6) 50 mg Oral Tablet Take 1 Tablet (50 mg total) by mouth Once a day      selegiline  (ELDYPRYL) 5 mg Oral Capsule       vitamin E 200 unit Oral Capsule Take 1 Capsule (200 Units total) by mouth Once a day       No current facility-administered medications for this visit.     No Known Allergies  Social History     Socioeconomic History    Marital status: Divorced     Spouse name: Not on file    Number of children: Not on file    Years of education: Not on file    Highest education level: Not on file   Occupational History    Not on file   Tobacco Use    Smoking status: Never    Smokeless tobacco: Never   Vaping Use    Vaping status: Never Used   Substance and Sexual Activity    Alcohol use: Yes     Alcohol/week: 1.0 standard drink of alcohol     Types: 1 Glasses of wine per week     Comment: nightly    Drug use: Never    Sexual activity: Not on file   Other Topics Concern    Not on file   Social History Narrative    Not on file     Social Determinants of Health     Financial Resource Strain: Not on file   Transportation Needs: Not on file   Social  Connections: Not on file   Intimate Partner Violence: Not on file   Housing Stability: Not on file       =====================================================================  GENERAL EXAMINATION  BP (!) 155/89   Pulse 83   Temp 36.8 C (98.2 F)   Wt 49 kg (108 lb)   SpO2 96%   BMI 19.75 kg/m     Vital signs personally reviewed    General: No acute distress, alert  HEENT: Normocephalic, no scleral icterus  Pulmonary: No accessory muscle use, no tachypnea  Cardiovascular: Heart with regular rate & rhythm  Extremities: No significant edema, No cyanosis    NEUROLOGIC EXAM  MENTAL STATUS: alert, oriented to person/place/time/situation  Speech clear and fluent with good repetition and comprehension, naming intact  Attention/concentration normal  Recent memory intact, remote memory intact, normal fund of knowledge  Able to follow simple and complex axial and appendicular commands without L/R confusion    CN  II: not directly tested, grossly intact  III, IV, VI: extraocular movements intact without nystagmus  V: intact to light touch  VII: face symmetric without weakness  VIII: grossly intact  IX, X: symmetric palatal elevation  XI: normal strength of trapezius and sternocleidomastoid bilaterally  XII: tongue midline with full movements    MOTOR  Bulk: normal  Tone: normal  Abnormal Movements: dyskinesia of head, LUE, bilateral lower extremities  Fasciculations: none  Bradykinesia: mildly reduced blink rate    Strength: Patient moving all extremities against gravity with good strength.    Reflexes: 3+ bilateral upper extremities, 2+ bilateral lower extremities    Sensory: Intact to Light Touch     Coordination: No dysmetria or dysdiadochokinesia    Gait: Normal gait, normal turns, good arm swing    =====================================================================  DATA  Personal review of prior labs is notable for:   No results found for: "VITB12", "FOLATE", "VITD25", "VITD", "LDLCHOL", "HA1C", "TSH",  "FREET4"  Personal review of imaging (with independent interpretation) is notable  for: patient brought CDs in for personal review  Personal Review of other prior diagnostics is notable for: not available for review   =====================================================================    Orders  Orders Placed This Encounter    selegiline  (ELDEPRYL ) 5 mg Oral Capsule    carbidopa -levodopa  (SINEMET  CR) 25-100 mg Oral Tablet Sustained Release

## 2023-12-05 ENCOUNTER — Telehealth (INDEPENDENT_AMBULATORY_CARE_PROVIDER_SITE_OTHER): Payer: Self-pay | Admitting: NEUROLOGY

## 2023-12-05 NOTE — Telephone Encounter (Signed)
 Patient needs refill on Selegiline  and Sinemet   sent to [facility]

## 2023-12-08 MED ORDER — SELEGILINE 5 MG CAPSULE
5.0000 mg | ORAL_CAPSULE | Freq: Every day | ORAL | 1 refills | Status: AC
Start: 2023-12-08 — End: ?

## 2023-12-08 MED ORDER — CARBIDOPA ER 25 MG-LEVODOPA 100 MG TABLET,EXTENDED RELEASE
1.0000 | ORAL_TABLET | Freq: Three times a day (TID) | ORAL | 1 refills | Status: AC
Start: 2023-12-08 — End: ?

## 2023-12-09 ENCOUNTER — Ambulatory Visit: Payer: Self-pay | Attending: NURSE PRACTITIONER | Admitting: NURSE PRACTITIONER

## 2023-12-09 ENCOUNTER — Other Ambulatory Visit: Payer: Self-pay

## 2023-12-09 ENCOUNTER — Encounter (INDEPENDENT_AMBULATORY_CARE_PROVIDER_SITE_OTHER): Payer: Self-pay | Admitting: NURSE PRACTITIONER

## 2023-12-09 VITALS — BP 138/78 | HR 76 | Ht 62.0 in | Wt 108.0 lb

## 2023-12-09 DIAGNOSIS — R002 Palpitations: Secondary | ICD-10-CM | POA: Insufficient documentation

## 2023-12-09 DIAGNOSIS — I10 Essential (primary) hypertension: Secondary | ICD-10-CM | POA: Insufficient documentation

## 2023-12-09 DIAGNOSIS — R9431 Abnormal electrocardiogram [ECG] [EKG]: Secondary | ICD-10-CM

## 2023-12-09 DIAGNOSIS — E785 Hyperlipidemia, unspecified: Secondary | ICD-10-CM | POA: Insufficient documentation

## 2023-12-09 LAB — ECG W INTERP (AMB USE ONLY)(MUSE,IN CLINIC)
Atrial Rate: 78 {beats}/min
Calculated P Axis: 82 degrees
Calculated R Axis: 96 degrees
Calculated T Axis: 86 degrees
PR Interval: 136 ms
QRS Duration: 76 ms
QT Interval: 368 ms
QTC Calculation: 419 ms
Ventricular rate: 78 {beats}/min

## 2023-12-09 NOTE — Progress Notes (Signed)
 [facility], 73 y.o. female  Date of Service: 12/09/2023  Date of Birth:  May 31, 1950  PCP:  Erika Silversmith, PA-C  Chief Complaint   Patient presents with    Follow Up    Hypertension    Hyperlipidemia        HPI:    The patient has no history of coronary artery disease. She has not had any prior cardiac testing. She has a history of hyperlipidemia and palpitations.12/2022 calcium score 55 AU    10-31-20 The patient is here for yearly f/u.  She denies any chest pains or shortness of breath.  She does have palpitations, but these are well controlled.  She has been having issues with fluctuating blood pressure.  Ultimately her primary care changed her to lisinopril.  She did bring in a log of her blood pressures daily for the last several months.  Overall her numbers I think look excellent.  She is just taking the lisinopril as needed when her blood pressure is elevated which is only a few times a month.  She also was recently diagnosed with Parkinson's. Labs in March 2022 showed BUN 24, creatinine 0.95, sodium 141, potassium 4.6.     11/20/21 The patient is here for yearly f/u.  She reports she is doing well.  She denies any chest pains or shortness of breath.  Denies any palpitations.  She still just takes lisinopril as needed for blood pressure.  It was elevated today, but she reports at home it is usually in the 130s over the 60s to 70s.  She had lab work in November of 2022 that showed total cholesterol 216, triglycerides 79, HDL 115, LDL 133.       12/10/22 The patient presents for f/u. She has persistent palpitations that are mild and intermittent.    12/09/2023 The pt presents for annual follow up.  She denies any chest pain, or shortness of breath.  She has occasional asymptomatic palpitations.  The blood pressures been well controlled at home.  She reports recent labs however no copies for review we will request.  Her 12/2022 calcium score 55 AU    EKG: SR 78bpm,  rightward axis  LAB: 08/2022  LDL 117  No results found for: CHOLESTEROL, HDLCHOL, LDLCHOL, LDLCHOLDIR, TRIG    No results found for: HA1C        Past Medical History:   Diagnosis Date    Dyslipidemia     Essential hypertension     Palpitations        Past Surgical History:   Procedure Laterality Date    HX APPENDECTOMY      HX CHOLECYSTECTOMY      HX EYELID SURGERY  2020    HX TUBAL LIGATION         Current Outpatient Medications   Medication Sig    ascorbic acid (VITAMIN C) 1,000 mg Oral Tablet Take 1 Tablet (1,000 mg total) by mouth Daily    aspirin (ECOTRIN) 81 mg Oral Tablet, Delayed Release (E.C.) Take 1 Tablet (81 mg total) by mouth Daily    azelastine (ASTELIN) 137 mcg (0.1 %) Nasal Aerosol, Spray Administer 1 Spray into affected nostril(s) Once a day    Beta Carotene 7,500 mcg (25,000 unit) Oral Capsule Take by mouth    biotin-keratin 10,000-100 mcg-mg Oral Tablet Take 1 Tablet by mouth Daily    brimonidine (ALPHAGAN) 0.2 % Ophthalmic Drops 1 Drop    calcium citrate 250  mg calcium Oral Tablet Take by mouth    carbidopa -levodopa  (SINEMET  CR) 25-100 mg Oral Tablet Sustained Release Take 1 Tablet by mouth Three times a day    cholecalciferol, Vitamin D3, (VITAMIN D-3) 125 mcg (5,000 unit) Oral Tablet Take 1 Tablet (5,000 Units total) by mouth Daily    ferrous sulfate (FERATAB) 324 mg (65 mg iron) Oral Tablet, Delayed Release (E.C.) Take 1 Tablet (324 mg total) by mouth Every other day    latanoprost (XALATAN) 0.005 % Ophthalmic Drops     meloxicam (MOBIC) 15 mg Oral Tablet Take 1 Tablet (15 mg total) by mouth Once per day as needed for Pain    montelukast (SINGULAIR) 10 mg Oral Tablet Take 1 Tablet (10 mg total) by mouth Daily    pyridoxine, vitamin B6, (VITAMIN B6) 50 mg Oral Tablet Take 1 Tablet (50 mg total) by mouth Daily    selegiline  (ELDEPRYL ) 5 mg Oral Capsule Take 1 Capsule (5 mg total) by mouth Daily    vitamin E 200 unit Oral Capsule Take 1 Capsule (200 Units total) by mouth Daily      ROS: Other than issues noted in HPI, all other systems were negative.     Exam:  Vitals:    12/09/23 1305   BP: 138/78   Pulse: 76   SpO2: 100%   Weight: 49 kg (108 lb)   Height: 1.575 m (5' 2)   BMI: 19.75       General: No acute distress and appears stated age.    HEENT:Head normocephalic, atraumatic. ENT without erythema or injection, mucouse membranes moist.    Neck: No JVD, no carotid bruit. and supple, symmetrical, trachea midline.   Lungs: Clear to auscultation bilaterally.    Cardiovascular: Regular rate and rhythm, normal S1 S2, no murmur, no rub, or gallop, no thrill     Abdomen: Soft, non-tender and bowel sounds normal.    Extremities: Extremities normal, atraumatic, no cyanosis or edema.    Skin: Skin warm and dry.    Neurologic: Alert and oriented x3.  Psych: Mood and affect congruent for age and gender     Orders placed this visit:  Orders Placed This Encounter    EKG (In-Clinic Today)       Assessment/Plan:  Hypertension, unspecified type    Hyperlipidemia, unspecified hyperlipidemia type    Palpitations    Continue low-fat low-cholesterol heart healthy diet.  Continue to monitor blood pressure with goal less than 140/90 more often than not.  Continue to limit caffeine to prevent palpitations however if symptoms become problematic would start beta-blocker.  Return to clinic in 1 year    Erika Wallace [place], [facility] 12/09/2023 13:30

## 2024-01-05 ENCOUNTER — Telehealth (INDEPENDENT_AMBULATORY_CARE_PROVIDER_SITE_OTHER): Payer: Self-pay | Admitting: NEUROLOGY

## 2024-01-05 NOTE — Telephone Encounter (Signed)
 Patient called and is on Dopamine and her mornings start off slow. She takes her meds at 7 and 5 and wants to know if she can add an extra dopamine att 11 pm.

## 2024-01-21 ENCOUNTER — Encounter (INDEPENDENT_AMBULATORY_CARE_PROVIDER_SITE_OTHER): Payer: Self-pay | Admitting: NEUROLOGY

## 2024-01-21 NOTE — Nursing Note (Signed)
 Spoke with the patient and let her know that she could take the extra pill that Dr. Sherre stated it was ok back in August.

## 2024-03-19 ENCOUNTER — Encounter (INDEPENDENT_AMBULATORY_CARE_PROVIDER_SITE_OTHER): Admitting: NEUROLOGY

## 2024-03-22 ENCOUNTER — Encounter (INDEPENDENT_AMBULATORY_CARE_PROVIDER_SITE_OTHER): Payer: Self-pay | Admitting: NEUROLOGY

## 2024-04-02 ENCOUNTER — Ambulatory Visit (INDEPENDENT_AMBULATORY_CARE_PROVIDER_SITE_OTHER): Admitting: NEUROLOGY

## 2024-04-02 ENCOUNTER — Encounter (INDEPENDENT_AMBULATORY_CARE_PROVIDER_SITE_OTHER): Payer: Self-pay | Admitting: NEUROLOGY

## 2024-04-02 ENCOUNTER — Other Ambulatory Visit: Payer: Self-pay

## 2024-04-02 VITALS — BP 117/60 | HR 78 | Temp 96.9°F | Wt 108.2 lb

## 2024-04-02 DIAGNOSIS — G20B1 Parkinson's disease with dyskinesia, without mention of fluctuations: Secondary | ICD-10-CM

## 2024-04-02 NOTE — Progress Notes (Signed)
 NEUROLOGY, [facility]  [address]  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 She reports stiffness and decreased stamina in Erika legs but denies falls or tremors. On examination, she demonstrates mild decreased blink rate and dyskinesia (head, left arm, bilateral lower extremities), which is not bothersome to Erika. The disease appears to be affecting Erika left side more prominently. She maintains good function, including the ability to walk for 30 minutes daily. No significant cognitive impairment is noted at this time. The patient's symptoms seem to be progressing slowly, which is favorable given Erika age and time since diagnosis.  Continue Sinemet  (carbidopa /levodopa ) 25/100 mg controlled release, 1 tablet PO TID  Continue selegiline  5 mg, 0.5 tablet PO at 7 AM and 0.5 tablet at noon  Provided education on avoiding high-protein meals with Sinemet  administration  Encouraged to maintain current level of physical activity  Monitor for development of tremors, worsening symptoms, falls, or cognitive changes  Follow up in 6 months or sooner if new symptoms develop    Thank you for allowing me to participate in your patient's care and please do not hesitate to contact me for any questions or concerns.    On the day of the encounter, a total of 60 minutes was spent on this patient encounter including review of historical information, examination, documentation and post-visit activities. The time documented excludes procedural time.    CANDIE Darlyn Free, DO  Assistant Professor of Neurology  [place]  [facility]      =====================================================================    NAME:  Erika Wallace  DOB:  Jul 02, 1950  VISIT DATE:  04/02/2024     CC:  Parkinson's  disease    Patient seen in consultation at the request of   History obtained from the patient and chart/records  Age of patient:  73 y.o.    I had the pleasure of seeing Erika Wallace for outpatient consultation, who is a 73 y.o. year old female and is being seen for management of above CC.    INTERVAL: Since last visit, she reports overall stability in Erika symptoms.  Continue to experience fatigue throughout the day though is able to rest and carry on with Erika activities.  Dyskinesias are still not bothersome.  She continues to walk on a daily basis for exercise.  She denies any falls.  She is overall happy with level of control of Erika symptoms.  No new concerns.    HPI:      09/11/2023  73 year old female with a history of Parkinson's disease, diagnosed in 2022, presenting for follow-up. She reports overall good symptom control with Erika current medication regimen of Sinemet  (carbidopa /levodopa  CR) and selegiline .    Erika Wallace notes that Erika main symptoms are stiffness and lack of stamina, particularly in Erika legs. She denies experiencing falls or tremors. The patient reports that Erika symptoms improve within 15-30 minutes of taking Sinemet . She mentions noticing when Erika medication begins to wear off but does not specify any particular timing for this. Erika Wallace has adapted to Erika condition and continues to stay active, walking for 30 minutes daily either outside or following a path through Erika house.    The patient reports some sleep disturbances, typically waking between 3 and 4 AM, then returning to sleep until 6:30 or 7 AM. She denies acting out Erika dreams  or thrashing around during sleep. Erika Wallace experiences some discomfort in Erika hips when turning over in bed but clarifies that this is not due to stiffness. She denies any difficulty swallowing or significant drooling.    Medical History  - Parkinson's disease diagnosed in 2022    Medications and Supplements  - Sinemet  25/100 mg by mouth one tablet three times  a day  - Selegiline  half tablet at 7 AM and half at noon  - Vitamin supplements    Family History  - No family history of Parkinson's disease    Social History  - Living Situation: Lives alone, sleeps alone  - Exercise: Walks for 30 minutes daily  - Diet: Takes vitamin supplements, appetite is fine  - Sleep: Sleeps from 11:30 PM to 3-4 AM, then until 6:30-7 AM  - Mood: Mood is okay, no anxiety or depression  - Social Activity: Patient reports being social    Review of Systems  - General: Positive for fatigue  - HEENT: Negative for drooling, trouble swallowing  - Cardiovascular: Positive for low blood pressure in the mornings  - Musculoskeletal: Positive for hip discomfort when turning in bed  - Neurological: Negative for tremor, positive for stiffness, especially in the morning  - Psychiatric: Negative for anxiety, depression    ----------------------------------------------------  Per Dr. Kelleen last note 04/22/2023:  Erika Wallace drove Erika here. She has Parkinson disease with predominant left-sided manifestations including shuffling gait, imbalance on walking, chronic fatigue, hyposmia, dysphagia and clinically asymptomatic peripheral neuropathy at baseline. She has done well since last seen 6 months ago. She is on Sinemet  and selegiline . She had no falls, choking or shuffling since last seen. She has history of anxiety which is currently in remission. She reports no signs or symptoms of depression, psychosis or memory impairment. She has history of L3-4 spinal stenosis which is clinically asymptomatic. Time spent completing encounter was 30 minutes.  No follow-up has been scheduled since this practice is closing. Appropriate instructions have been provided in that regard.    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 (CMS [facility])     Past Surgical History:   Procedure  Laterality Date    HX APPENDECTOMY      HX CHOLECYSTECTOMY      HX EYELID SURGERY  2020    HX TUBAL LIGATION       Family Medical History:       Problem Relation (Age of Onset)    COPD Father    Colon Cancer Mother          Current Outpatient Medications   Medication Sig Dispense Refill    ascorbic acid (VITAMIN C) 1,000 mg Oral Tablet Take 1 Tablet (1,000 mg total) by mouth Daily      aspirin (ECOTRIN) 81 mg Oral Tablet, Delayed Release (E.C.) Take 1 Tablet (81 mg total) by mouth Daily      azelastine (ASTELIN) 137 mcg (0.1 %) Nasal Aerosol, Spray Administer 1 Spray into affected nostril(s) Once a day      Beta Carotene 7,500 mcg (25,000 unit) Oral Capsule Take by mouth      biotin-keratin 10,000-100 mcg-mg Oral Tablet Take 1 Tablet by mouth Daily      brimonidine (ALPHAGAN) 0.2 % Ophthalmic Drops 1 Drop      calcium citrate 250 mg calcium Oral Tablet Take by mouth      carbidopa -levodopa  (SINEMET  CR) 25-100 mg Oral  Tablet Sustained Release Take 1 Tablet by mouth Three times a day 270 Tablet 1    cholecalciferol, Vitamin D3, (VITAMIN D-3) 125 mcg (5,000 unit) Oral Tablet Take 1 Tablet (5,000 Units total) by mouth Daily      ferrous sulfate (FERATAB) 324 mg (65 mg iron) Oral Tablet, Delayed Release (E.C.) Take 1 Tablet (324 mg total) by mouth Every other day      guaiFENesin (MUCINEX) 600 mg Oral Tablet Extended Release 12hr       latanoprost (XALATAN) 0.005 % Ophthalmic Drops       meloxicam (MOBIC) 15 mg Oral Tablet Take 1 Tablet (15 mg total) by mouth Once per day as needed for Pain      montelukast (SINGULAIR) 10 mg Oral Tablet Take 1 Tablet (10 mg total) by mouth Daily      pyridoxine, vitamin B6, (VITAMIN B6) 50 mg Oral Tablet Take 1 Tablet (50 mg total) by mouth Daily      selegiline  (ELDEPRYL ) 5 mg Oral Capsule Take 1 Capsule (5 mg total) by mouth Daily 90 Capsule 1    spironolactone (ALDACTONE) 25 mg Oral Tablet Take 1 Tablet (25 mg total) by mouth Daily      vitamin E 200 unit Oral Capsule Take 1 Capsule  (200 Units total) by mouth Daily       No current facility-administered medications for this visit.     No Known Allergies  Social History     Socioeconomic History    Marital status: Divorced     Spouse name: Not on file    Number of children: Not on file    Years of education: Not on file    Highest education level: Not on file   Occupational History    Not on file   Tobacco Use    Smoking status: Never    Smokeless tobacco: Never   Vaping Use    Vaping status: Never Used   Substance and Sexual Activity    Alcohol use: Yes     Alcohol/week: 1.0 standard drink of alcohol     Types: 1 Glasses of wine per week     Comment: nightly    Drug use: Never    Sexual activity: Not on file   Other Topics Concern    Not on file   Social History Narrative    Not on file     Social Determinants of Health     Financial Resource Strain: Not on file   Transportation Needs: Not on file   Social Connections: Not on file   Intimate Partner Violence: Not on file   Housing Stability: Not on file       =====================================================================  GENERAL EXAMINATION  BP 117/60 (Site: Left Arm, Patient Position: Sitting)   Pulse 78   Temp 36.1 C (96.9 F) (Temporal)   Wt 49.1 kg (108 lb 3.2 oz)   SpO2 98%   BMI 19.79 kg/m     Vital signs personally reviewed    General: No acute distress, alert  HEENT: Normocephalic, no scleral icterus  Extremities: No significant edema, No cyanosis    NEUROLOGIC EXAM  MENTAL STATUS: alert, answering questions appropriately    CN  II: not directly tested, grossly intact  III, IV, VI: extraocular movements intact without nystagmus  V: intact to light touch  VII: face symmetric without weakness  VIII: grossly intact  IX, X: symmetric palatal elevation  XI: normal strength of trapezius and sternocleidomastoid bilaterally  XII: tongue midline  with full movements    MOTOR  Bulk: normal  Tone:  Mildly increased in the right upper extremity  Abnormal Movements: dyskinesia of head,  LUE, bilateral lower extremities  Fasciculations: none    Strength: 5/5 throughout    Reflexes: 3+ bilateral upper extremities, 2+ bilateral lower extremities    Sensory: Intact to Light Touch     Coordination: No dysmetria     Gait: Normal gait, normal turns, good arm swing    =====================================================================  DATA  Personal review of prior labs is notable for:   No results found for: VITB12, FOLATE, VITD25, VITD, LDLCHOL, HA1C, TSH, FREET4  Personal review of imaging (with independent interpretation) is notable for: patient brought CDs in for personal review  Personal Review of other prior diagnostics is notable for: not available for review   =====================================================================    Orders  No orders of the defined types were placed in this encounter.

## 2024-10-01 ENCOUNTER — Encounter (INDEPENDENT_AMBULATORY_CARE_PROVIDER_SITE_OTHER): Payer: Self-pay | Admitting: NEUROLOGY

## 2024-12-08 ENCOUNTER — Ambulatory Visit (INDEPENDENT_AMBULATORY_CARE_PROVIDER_SITE_OTHER): Payer: Self-pay | Admitting: PHYSICIAN ASSISTANT
